# Patient Record
Sex: Female | Born: 1940 | Race: White | Hispanic: No | Marital: Married | Smoking: Never smoker
Health system: Southern US, Community
[De-identification: ages and names within clinical notes are randomized; demographics above are authoritative.]

## PROBLEM LIST (undated history)

## (undated) DIAGNOSIS — F329 Major depressive disorder, single episode, unspecified: Secondary | ICD-10-CM

## (undated) DIAGNOSIS — D219 Benign neoplasm of connective and other soft tissue, unspecified: Secondary | ICD-10-CM

## (undated) DIAGNOSIS — M8588 Other specified disorders of bone density and structure, other site: Secondary | ICD-10-CM

## (undated) DIAGNOSIS — M069 Rheumatoid arthritis, unspecified: Secondary | ICD-10-CM

## (undated) DIAGNOSIS — E7800 Pure hypercholesterolemia, unspecified: Secondary | ICD-10-CM

## (undated) DIAGNOSIS — E739 Lactose intolerance, unspecified: Secondary | ICD-10-CM

## (undated) DIAGNOSIS — M353 Polymyalgia rheumatica: Secondary | ICD-10-CM

## (undated) DIAGNOSIS — D649 Anemia, unspecified: Secondary | ICD-10-CM

## (undated) DIAGNOSIS — E559 Vitamin D deficiency, unspecified: Secondary | ICD-10-CM

## (undated) DIAGNOSIS — E063 Autoimmune thyroiditis: Secondary | ICD-10-CM

## (undated) DIAGNOSIS — M85859 Other specified disorders of bone density and structure, unspecified thigh: Secondary | ICD-10-CM

## (undated) DIAGNOSIS — E785 Hyperlipidemia, unspecified: Secondary | ICD-10-CM

## (undated) DIAGNOSIS — F4323 Adjustment disorder with mixed anxiety and depressed mood: Secondary | ICD-10-CM

## (undated) DIAGNOSIS — I10 Essential (primary) hypertension: Secondary | ICD-10-CM

## (undated) DIAGNOSIS — Z8739 Personal history of other diseases of the musculoskeletal system and connective tissue: Secondary | ICD-10-CM

## (undated) DIAGNOSIS — R202 Paresthesia of skin: Secondary | ICD-10-CM

## (undated) HISTORY — DX: Autoimmune thyroiditis: E06.3

## (undated) HISTORY — DX: Other specified disorders of bone density and structure, other site: M85.88

## (undated) HISTORY — DX: Vitamin D deficiency, unspecified: E55.9

## (undated) HISTORY — DX: Hyperlipidemia, unspecified: E78.5

## (undated) HISTORY — DX: Pure hypercholesterolemia, unspecified: E78.00

## (undated) HISTORY — DX: Benign neoplasm of connective and other soft tissue, unspecified: D21.9

## (undated) HISTORY — DX: Essential (primary) hypertension: I10

## (undated) HISTORY — DX: Lactose intolerance, unspecified: E73.9

## (undated) HISTORY — DX: Adjustment disorder with mixed anxiety and depressed mood: F43.23

## (undated) HISTORY — DX: Paresthesia of skin: R20.2

## (undated) HISTORY — DX: Polymyalgia rheumatica: M35.3

## (undated) HISTORY — DX: Rheumatoid arthritis, unspecified: M06.9

## (undated) HISTORY — DX: Personal history of other diseases of the musculoskeletal system and connective tissue: Z87.39

## (undated) HISTORY — DX: Other specified disorders of bone density and structure, unspecified thigh: M85.859

## (undated) HISTORY — DX: Major depressive disorder, single episode, unspecified: F32.9

---
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
## 1981-04-08 HISTORY — PX: PARTIAL HYSTERECTOMY: SHX80

## 1982-04-08 HISTORY — PX: ABDOMINAL HYSTERECTOMY: SHX81

## 2001-04-08 HISTORY — PX: COLONOSCOPY: SHX174

## 2011-04-09 HISTORY — PX: COLONOSCOPY: SHX174

## 2011-04-18 DIAGNOSIS — Z23 Encounter for immunization: Secondary | ICD-10-CM | POA: Diagnosis not present

## 2011-05-14 DIAGNOSIS — M353 Polymyalgia rheumatica: Secondary | ICD-10-CM | POA: Diagnosis not present

## 2011-05-14 DIAGNOSIS — M899 Disorder of bone, unspecified: Secondary | ICD-10-CM | POA: Diagnosis not present

## 2011-05-14 DIAGNOSIS — M069 Rheumatoid arthritis, unspecified: Secondary | ICD-10-CM | POA: Diagnosis not present

## 2011-05-14 DIAGNOSIS — Z79899 Other long term (current) drug therapy: Secondary | ICD-10-CM | POA: Diagnosis not present

## 2011-05-14 DIAGNOSIS — M949 Disorder of cartilage, unspecified: Secondary | ICD-10-CM | POA: Diagnosis not present

## 2011-05-29 DIAGNOSIS — R079 Chest pain, unspecified: Secondary | ICD-10-CM | POA: Diagnosis not present

## 2011-05-29 DIAGNOSIS — E785 Hyperlipidemia, unspecified: Secondary | ICD-10-CM | POA: Diagnosis not present

## 2011-05-29 DIAGNOSIS — I119 Hypertensive heart disease without heart failure: Secondary | ICD-10-CM | POA: Diagnosis not present

## 2011-05-29 DIAGNOSIS — I10 Essential (primary) hypertension: Secondary | ICD-10-CM | POA: Diagnosis not present

## 2011-06-11 DIAGNOSIS — I10 Essential (primary) hypertension: Secondary | ICD-10-CM | POA: Diagnosis not present

## 2011-06-11 DIAGNOSIS — E785 Hyperlipidemia, unspecified: Secondary | ICD-10-CM | POA: Diagnosis not present

## 2011-07-10 DIAGNOSIS — M353 Polymyalgia rheumatica: Secondary | ICD-10-CM | POA: Diagnosis not present

## 2011-07-10 DIAGNOSIS — M069 Rheumatoid arthritis, unspecified: Secondary | ICD-10-CM | POA: Diagnosis not present

## 2011-07-10 DIAGNOSIS — Z79899 Other long term (current) drug therapy: Secondary | ICD-10-CM | POA: Diagnosis not present

## 2011-07-10 DIAGNOSIS — M2550 Pain in unspecified joint: Secondary | ICD-10-CM | POA: Diagnosis not present

## 2011-07-12 DIAGNOSIS — E785 Hyperlipidemia, unspecified: Secondary | ICD-10-CM | POA: Diagnosis not present

## 2011-08-28 DIAGNOSIS — M353 Polymyalgia rheumatica: Secondary | ICD-10-CM | POA: Diagnosis not present

## 2011-08-28 DIAGNOSIS — Z79899 Other long term (current) drug therapy: Secondary | ICD-10-CM | POA: Diagnosis not present

## 2011-08-28 DIAGNOSIS — M069 Rheumatoid arthritis, unspecified: Secondary | ICD-10-CM | POA: Diagnosis not present

## 2012-01-01 DIAGNOSIS — Z23 Encounter for immunization: Secondary | ICD-10-CM | POA: Diagnosis not present

## 2012-01-01 DIAGNOSIS — I059 Rheumatic mitral valve disease, unspecified: Secondary | ICD-10-CM | POA: Diagnosis not present

## 2012-01-01 DIAGNOSIS — I10 Essential (primary) hypertension: Secondary | ICD-10-CM | POA: Diagnosis not present

## 2012-01-01 DIAGNOSIS — M069 Rheumatoid arthritis, unspecified: Secondary | ICD-10-CM | POA: Diagnosis not present

## 2012-01-01 DIAGNOSIS — Z1212 Encounter for screening for malignant neoplasm of rectum: Secondary | ICD-10-CM | POA: Diagnosis not present

## 2012-01-01 DIAGNOSIS — M353 Polymyalgia rheumatica: Secondary | ICD-10-CM | POA: Diagnosis not present

## 2012-01-02 DIAGNOSIS — E7800 Pure hypercholesterolemia, unspecified: Secondary | ICD-10-CM | POA: Diagnosis not present

## 2012-01-02 DIAGNOSIS — M069 Rheumatoid arthritis, unspecified: Secondary | ICD-10-CM | POA: Diagnosis not present

## 2012-01-02 DIAGNOSIS — Z79899 Other long term (current) drug therapy: Secondary | ICD-10-CM | POA: Diagnosis not present

## 2012-01-02 DIAGNOSIS — I10 Essential (primary) hypertension: Secondary | ICD-10-CM | POA: Diagnosis not present

## 2012-01-03 DIAGNOSIS — M353 Polymyalgia rheumatica: Secondary | ICD-10-CM | POA: Diagnosis not present

## 2012-01-03 DIAGNOSIS — M069 Rheumatoid arthritis, unspecified: Secondary | ICD-10-CM | POA: Diagnosis not present

## 2012-01-15 DIAGNOSIS — Z780 Asymptomatic menopausal state: Secondary | ICD-10-CM | POA: Diagnosis not present

## 2012-01-31 DIAGNOSIS — I059 Rheumatic mitral valve disease, unspecified: Secondary | ICD-10-CM | POA: Diagnosis not present

## 2012-01-31 DIAGNOSIS — I10 Essential (primary) hypertension: Secondary | ICD-10-CM | POA: Diagnosis not present

## 2012-01-31 DIAGNOSIS — E782 Mixed hyperlipidemia: Secondary | ICD-10-CM | POA: Diagnosis not present

## 2012-02-07 HISTORY — PX: TRANSTHORACIC ECHOCARDIOGRAM: SHX275

## 2012-02-20 ENCOUNTER — Other Ambulatory Visit (HOSPITAL_COMMUNITY): Payer: Self-pay

## 2012-02-20 DIAGNOSIS — I059 Rheumatic mitral valve disease, unspecified: Secondary | ICD-10-CM

## 2012-02-24 DIAGNOSIS — D126 Benign neoplasm of colon, unspecified: Secondary | ICD-10-CM | POA: Diagnosis not present

## 2012-02-24 DIAGNOSIS — K648 Other hemorrhoids: Secondary | ICD-10-CM | POA: Diagnosis not present

## 2012-02-24 DIAGNOSIS — Z1211 Encounter for screening for malignant neoplasm of colon: Secondary | ICD-10-CM | POA: Diagnosis not present

## 2012-02-26 ENCOUNTER — Ambulatory Visit (HOSPITAL_COMMUNITY)
Admission: RE | Admit: 2012-02-26 | Discharge: 2012-02-26 | Disposition: A | Discharge status: Home / Self Care | Payer: Medicare Other | Source: Ambulatory Visit | Attending: Cardiology | Admitting: Cardiology

## 2012-02-26 DIAGNOSIS — E785 Hyperlipidemia, unspecified: Secondary | ICD-10-CM | POA: Insufficient documentation

## 2012-02-26 DIAGNOSIS — I059 Rheumatic mitral valve disease, unspecified: Secondary | ICD-10-CM | POA: Diagnosis not present

## 2012-02-26 DIAGNOSIS — I10 Essential (primary) hypertension: Secondary | ICD-10-CM | POA: Insufficient documentation

## 2012-02-26 DIAGNOSIS — I079 Rheumatic tricuspid valve disease, unspecified: Secondary | ICD-10-CM | POA: Insufficient documentation

## 2012-02-26 NOTE — Progress Notes (Signed)
 [facility]   2D echo completed 02/26/2012.   Veda Canning, RDCS

## 2012-03-04 DIAGNOSIS — M353 Polymyalgia rheumatica: Secondary | ICD-10-CM | POA: Diagnosis not present

## 2012-03-04 DIAGNOSIS — M069 Rheumatoid arthritis, unspecified: Secondary | ICD-10-CM | POA: Diagnosis not present

## 2012-03-12 DIAGNOSIS — E782 Mixed hyperlipidemia: Secondary | ICD-10-CM | POA: Diagnosis not present

## 2012-03-12 DIAGNOSIS — I10 Essential (primary) hypertension: Secondary | ICD-10-CM | POA: Diagnosis not present

## 2012-07-07 DIAGNOSIS — M069 Rheumatoid arthritis, unspecified: Secondary | ICD-10-CM | POA: Diagnosis not present

## 2012-07-07 DIAGNOSIS — M79609 Pain in unspecified limb: Secondary | ICD-10-CM | POA: Diagnosis not present

## 2012-07-07 DIAGNOSIS — M353 Polymyalgia rheumatica: Secondary | ICD-10-CM | POA: Diagnosis not present

## 2012-07-28 ENCOUNTER — Other Ambulatory Visit: Payer: Self-pay

## 2012-07-28 DIAGNOSIS — Z1231 Encounter for screening mammogram for malignant neoplasm of breast: Secondary | ICD-10-CM

## 2012-09-02 ENCOUNTER — Ambulatory Visit: Payer: PRIVATE HEALTH INSURANCE

## 2012-09-17 ENCOUNTER — Other Ambulatory Visit: Payer: Self-pay | Admitting: Internal Medicine

## 2012-09-17 DIAGNOSIS — Z0000 Encounter for general adult medical examination without abnormal findings: Secondary | ICD-10-CM | POA: Diagnosis not present

## 2012-09-17 DIAGNOSIS — M353 Polymyalgia rheumatica: Secondary | ICD-10-CM | POA: Diagnosis not present

## 2012-09-17 DIAGNOSIS — Z006 Encounter for examination for normal comparison and control in clinical research program: Secondary | ICD-10-CM | POA: Diagnosis not present

## 2012-09-17 DIAGNOSIS — E7800 Pure hypercholesterolemia, unspecified: Secondary | ICD-10-CM | POA: Diagnosis not present

## 2012-09-17 DIAGNOSIS — G629 Polyneuropathy, unspecified: Secondary | ICD-10-CM

## 2012-09-17 DIAGNOSIS — G609 Hereditary and idiopathic neuropathy, unspecified: Secondary | ICD-10-CM | POA: Diagnosis not present

## 2012-09-17 DIAGNOSIS — K219 Gastro-esophageal reflux disease without esophagitis: Secondary | ICD-10-CM | POA: Diagnosis not present

## 2012-09-17 DIAGNOSIS — I10 Essential (primary) hypertension: Secondary | ICD-10-CM | POA: Diagnosis not present

## 2012-09-18 DIAGNOSIS — E7800 Pure hypercholesterolemia, unspecified: Secondary | ICD-10-CM | POA: Diagnosis not present

## 2012-09-22 ENCOUNTER — Ambulatory Visit
Admission: RE | Admit: 2012-09-22 | Discharge: 2012-09-22 | Disposition: A | Discharge status: Home / Self Care | Payer: Medicare Other | Source: Ambulatory Visit | Attending: Internal Medicine | Admitting: Internal Medicine

## 2012-09-22 DIAGNOSIS — G629 Polyneuropathy, unspecified: Secondary | ICD-10-CM

## 2012-09-22 DIAGNOSIS — I739 Peripheral vascular disease, unspecified: Secondary | ICD-10-CM | POA: Diagnosis not present

## 2012-09-24 ENCOUNTER — Ambulatory Visit
Admission: RE | Admit: 2012-09-24 | Discharge: 2012-09-24 | Disposition: A | Discharge status: Home / Self Care | Payer: PRIVATE HEALTH INSURANCE | Source: Ambulatory Visit

## 2012-09-24 DIAGNOSIS — Z1231 Encounter for screening mammogram for malignant neoplasm of breast: Secondary | ICD-10-CM

## 2012-09-30 DIAGNOSIS — G609 Hereditary and idiopathic neuropathy, unspecified: Secondary | ICD-10-CM | POA: Diagnosis not present

## 2012-11-10 DIAGNOSIS — M353 Polymyalgia rheumatica: Secondary | ICD-10-CM | POA: Diagnosis not present

## 2012-11-10 DIAGNOSIS — M7660 Achilles tendinitis, unspecified leg: Secondary | ICD-10-CM | POA: Diagnosis not present

## 2012-11-10 DIAGNOSIS — M069 Rheumatoid arthritis, unspecified: Secondary | ICD-10-CM | POA: Diagnosis not present

## 2013-01-06 DIAGNOSIS — R05 Cough: Secondary | ICD-10-CM | POA: Diagnosis not present

## 2013-01-06 DIAGNOSIS — R059 Cough, unspecified: Secondary | ICD-10-CM | POA: Diagnosis not present

## 2013-01-06 DIAGNOSIS — R12 Heartburn: Secondary | ICD-10-CM | POA: Diagnosis not present

## 2013-03-01 DIAGNOSIS — Z79899 Other long term (current) drug therapy: Secondary | ICD-10-CM | POA: Diagnosis not present

## 2013-03-01 DIAGNOSIS — I10 Essential (primary) hypertension: Secondary | ICD-10-CM | POA: Diagnosis not present

## 2013-03-01 DIAGNOSIS — Z23 Encounter for immunization: Secondary | ICD-10-CM | POA: Diagnosis not present

## 2013-03-01 DIAGNOSIS — Z0000 Encounter for general adult medical examination without abnormal findings: Secondary | ICD-10-CM | POA: Diagnosis not present

## 2013-03-01 DIAGNOSIS — E7800 Pure hypercholesterolemia, unspecified: Secondary | ICD-10-CM | POA: Diagnosis not present

## 2013-03-01 DIAGNOSIS — M353 Polymyalgia rheumatica: Secondary | ICD-10-CM | POA: Diagnosis not present

## 2013-03-10 DIAGNOSIS — I10 Essential (primary) hypertension: Secondary | ICD-10-CM | POA: Diagnosis not present

## 2013-03-10 DIAGNOSIS — E7800 Pure hypercholesterolemia, unspecified: Secondary | ICD-10-CM | POA: Diagnosis not present

## 2013-03-10 DIAGNOSIS — R059 Cough, unspecified: Secondary | ICD-10-CM | POA: Diagnosis not present

## 2013-03-10 DIAGNOSIS — M069 Rheumatoid arthritis, unspecified: Secondary | ICD-10-CM | POA: Diagnosis not present

## 2013-03-10 DIAGNOSIS — M353 Polymyalgia rheumatica: Secondary | ICD-10-CM | POA: Diagnosis not present

## 2013-03-11 DIAGNOSIS — M069 Rheumatoid arthritis, unspecified: Secondary | ICD-10-CM | POA: Diagnosis not present

## 2013-03-11 DIAGNOSIS — M7660 Achilles tendinitis, unspecified leg: Secondary | ICD-10-CM | POA: Diagnosis not present

## 2013-03-11 DIAGNOSIS — R059 Cough, unspecified: Secondary | ICD-10-CM | POA: Diagnosis not present

## 2013-03-11 DIAGNOSIS — M353 Polymyalgia rheumatica: Secondary | ICD-10-CM | POA: Diagnosis not present

## 2013-03-17 DIAGNOSIS — E2839 Other primary ovarian failure: Secondary | ICD-10-CM | POA: Diagnosis not present

## 2013-04-20 DIAGNOSIS — F411 Generalized anxiety disorder: Secondary | ICD-10-CM | POA: Diagnosis not present

## 2013-04-20 DIAGNOSIS — I10 Essential (primary) hypertension: Secondary | ICD-10-CM | POA: Diagnosis not present

## 2013-04-20 DIAGNOSIS — H6120 Impacted cerumen, unspecified ear: Secondary | ICD-10-CM | POA: Diagnosis not present

## 2013-06-02 DIAGNOSIS — Z79899 Other long term (current) drug therapy: Secondary | ICD-10-CM | POA: Diagnosis not present

## 2013-06-02 DIAGNOSIS — M069 Rheumatoid arthritis, unspecified: Secondary | ICD-10-CM | POA: Diagnosis not present

## 2013-06-07 DIAGNOSIS — H43819 Vitreous degeneration, unspecified eye: Secondary | ICD-10-CM | POA: Diagnosis not present

## 2013-06-09 DIAGNOSIS — M7660 Achilles tendinitis, unspecified leg: Secondary | ICD-10-CM | POA: Diagnosis not present

## 2013-06-09 DIAGNOSIS — M353 Polymyalgia rheumatica: Secondary | ICD-10-CM | POA: Diagnosis not present

## 2013-06-09 DIAGNOSIS — M25559 Pain in unspecified hip: Secondary | ICD-10-CM | POA: Diagnosis not present

## 2013-06-09 DIAGNOSIS — M069 Rheumatoid arthritis, unspecified: Secondary | ICD-10-CM | POA: Diagnosis not present

## 2013-09-08 DIAGNOSIS — M353 Polymyalgia rheumatica: Secondary | ICD-10-CM | POA: Diagnosis not present

## 2013-09-08 DIAGNOSIS — M069 Rheumatoid arthritis, unspecified: Secondary | ICD-10-CM | POA: Diagnosis not present

## 2013-09-08 DIAGNOSIS — M7660 Achilles tendinitis, unspecified leg: Secondary | ICD-10-CM | POA: Diagnosis not present

## 2013-09-08 DIAGNOSIS — M25559 Pain in unspecified hip: Secondary | ICD-10-CM | POA: Diagnosis not present

## 2013-12-07 ENCOUNTER — Other Ambulatory Visit: Payer: Self-pay

## 2013-12-07 DIAGNOSIS — Z1231 Encounter for screening mammogram for malignant neoplasm of breast: Secondary | ICD-10-CM

## 2013-12-08 DIAGNOSIS — Z01419 Encounter for gynecological examination (general) (routine) without abnormal findings: Secondary | ICD-10-CM | POA: Diagnosis not present

## 2013-12-08 DIAGNOSIS — Z79890 Hormone replacement therapy (postmenopausal): Secondary | ICD-10-CM | POA: Diagnosis not present

## 2013-12-10 ENCOUNTER — Ambulatory Visit: Payer: Medicare Other

## 2013-12-16 ENCOUNTER — Encounter (INDEPENDENT_AMBULATORY_CARE_PROVIDER_SITE_OTHER): Payer: Self-pay

## 2013-12-16 ENCOUNTER — Ambulatory Visit
Admission: RE | Admit: 2013-12-16 | Discharge: 2013-12-16 | Disposition: A | Discharge status: Home / Self Care | Payer: Medicare Other | Source: Ambulatory Visit

## 2013-12-16 DIAGNOSIS — Z1231 Encounter for screening mammogram for malignant neoplasm of breast: Secondary | ICD-10-CM

## 2014-01-13 DIAGNOSIS — Z23 Encounter for immunization: Secondary | ICD-10-CM | POA: Diagnosis not present

## 2014-01-13 DIAGNOSIS — M353 Polymyalgia rheumatica: Secondary | ICD-10-CM | POA: Diagnosis not present

## 2014-01-13 DIAGNOSIS — M0589 Other rheumatoid arthritis with rheumatoid factor of multiple sites: Secondary | ICD-10-CM | POA: Diagnosis not present

## 2014-01-13 DIAGNOSIS — M7062 Trochanteric bursitis, left hip: Secondary | ICD-10-CM | POA: Diagnosis not present

## 2014-01-13 DIAGNOSIS — M7660 Achilles tendinitis, unspecified leg: Secondary | ICD-10-CM | POA: Diagnosis not present

## 2014-01-13 DIAGNOSIS — M7072 Other bursitis of hip, left hip: Secondary | ICD-10-CM | POA: Diagnosis not present

## 2014-04-06 DIAGNOSIS — E780 Pure hypercholesterolemia: Secondary | ICD-10-CM | POA: Diagnosis not present

## 2014-04-06 DIAGNOSIS — Z0000 Encounter for general adult medical examination without abnormal findings: Secondary | ICD-10-CM | POA: Diagnosis not present

## 2014-04-13 DIAGNOSIS — I10 Essential (primary) hypertension: Secondary | ICD-10-CM | POA: Diagnosis not present

## 2014-04-13 DIAGNOSIS — E780 Pure hypercholesterolemia: Secondary | ICD-10-CM | POA: Diagnosis not present

## 2014-04-13 DIAGNOSIS — G629 Polyneuropathy, unspecified: Secondary | ICD-10-CM | POA: Diagnosis not present

## 2014-04-13 DIAGNOSIS — L821 Other seborrheic keratosis: Secondary | ICD-10-CM | POA: Diagnosis not present

## 2014-05-04 DIAGNOSIS — H04123 Dry eye syndrome of bilateral lacrimal glands: Secondary | ICD-10-CM | POA: Diagnosis not present

## 2014-05-16 DIAGNOSIS — M353 Polymyalgia rheumatica: Secondary | ICD-10-CM | POA: Diagnosis not present

## 2014-05-16 DIAGNOSIS — M0589 Other rheumatoid arthritis with rheumatoid factor of multiple sites: Secondary | ICD-10-CM | POA: Diagnosis not present

## 2014-05-16 DIAGNOSIS — M7660 Achilles tendinitis, unspecified leg: Secondary | ICD-10-CM | POA: Diagnosis not present

## 2014-05-16 DIAGNOSIS — M7062 Trochanteric bursitis, left hip: Secondary | ICD-10-CM | POA: Diagnosis not present

## 2014-06-07 DIAGNOSIS — Z01419 Encounter for gynecological examination (general) (routine) without abnormal findings: Secondary | ICD-10-CM | POA: Diagnosis not present

## 2014-06-07 DIAGNOSIS — Z780 Asymptomatic menopausal state: Secondary | ICD-10-CM | POA: Diagnosis not present

## 2014-06-07 DIAGNOSIS — Z1212 Encounter for screening for malignant neoplasm of rectum: Secondary | ICD-10-CM | POA: Diagnosis not present

## 2014-08-15 DIAGNOSIS — M7062 Trochanteric bursitis, left hip: Secondary | ICD-10-CM | POA: Diagnosis not present

## 2014-08-15 DIAGNOSIS — M0589 Other rheumatoid arthritis with rheumatoid factor of multiple sites: Secondary | ICD-10-CM | POA: Diagnosis not present

## 2014-08-15 DIAGNOSIS — M353 Polymyalgia rheumatica: Secondary | ICD-10-CM | POA: Diagnosis not present

## 2014-08-15 DIAGNOSIS — M7660 Achilles tendinitis, unspecified leg: Secondary | ICD-10-CM | POA: Diagnosis not present

## 2014-11-29 DIAGNOSIS — M0589 Other rheumatoid arthritis with rheumatoid factor of multiple sites: Secondary | ICD-10-CM | POA: Diagnosis not present

## 2014-11-29 DIAGNOSIS — M353 Polymyalgia rheumatica: Secondary | ICD-10-CM | POA: Diagnosis not present

## 2014-11-29 DIAGNOSIS — M7062 Trochanteric bursitis, left hip: Secondary | ICD-10-CM | POA: Diagnosis not present

## 2015-02-10 ENCOUNTER — Other Ambulatory Visit: Payer: Self-pay

## 2015-02-10 DIAGNOSIS — Z1231 Encounter for screening mammogram for malignant neoplasm of breast: Secondary | ICD-10-CM

## 2015-02-15 ENCOUNTER — Ambulatory Visit
Admission: RE | Admit: 2015-02-15 | Discharge: 2015-02-15 | Disposition: A | Discharge status: Home / Self Care | Payer: Medicare Other | Source: Ambulatory Visit

## 2015-02-15 DIAGNOSIS — Z1231 Encounter for screening mammogram for malignant neoplasm of breast: Secondary | ICD-10-CM | POA: Diagnosis not present

## 2015-03-27 DIAGNOSIS — M7062 Trochanteric bursitis, left hip: Secondary | ICD-10-CM | POA: Diagnosis not present

## 2015-03-27 DIAGNOSIS — M0589 Other rheumatoid arthritis with rheumatoid factor of multiple sites: Secondary | ICD-10-CM | POA: Diagnosis not present

## 2015-03-27 DIAGNOSIS — M353 Polymyalgia rheumatica: Secondary | ICD-10-CM | POA: Diagnosis not present

## 2015-04-12 DIAGNOSIS — Z0000 Encounter for general adult medical examination without abnormal findings: Secondary | ICD-10-CM | POA: Diagnosis not present

## 2015-04-12 DIAGNOSIS — E7800 Pure hypercholesterolemia, unspecified: Secondary | ICD-10-CM | POA: Diagnosis not present

## 2015-04-12 DIAGNOSIS — D649 Anemia, unspecified: Secondary | ICD-10-CM | POA: Diagnosis not present

## 2015-04-12 DIAGNOSIS — Z79899 Other long term (current) drug therapy: Secondary | ICD-10-CM | POA: Diagnosis not present

## 2015-04-12 DIAGNOSIS — I10 Essential (primary) hypertension: Secondary | ICD-10-CM | POA: Diagnosis not present

## 2015-04-22 DIAGNOSIS — Z23 Encounter for immunization: Secondary | ICD-10-CM | POA: Diagnosis not present

## 2015-05-01 DIAGNOSIS — I10 Essential (primary) hypertension: Secondary | ICD-10-CM | POA: Diagnosis not present

## 2015-05-01 DIAGNOSIS — Z0000 Encounter for general adult medical examination without abnormal findings: Secondary | ICD-10-CM | POA: Diagnosis not present

## 2015-05-01 DIAGNOSIS — F329 Major depressive disorder, single episode, unspecified: Secondary | ICD-10-CM | POA: Diagnosis not present

## 2015-05-01 DIAGNOSIS — Z1212 Encounter for screening for malignant neoplasm of rectum: Secondary | ICD-10-CM | POA: Diagnosis not present

## 2015-05-01 DIAGNOSIS — Z01419 Encounter for gynecological examination (general) (routine) without abnormal findings: Secondary | ICD-10-CM | POA: Diagnosis not present

## 2015-05-01 DIAGNOSIS — E7800 Pure hypercholesterolemia, unspecified: Secondary | ICD-10-CM | POA: Diagnosis not present

## 2015-05-26 DIAGNOSIS — M25561 Pain in right knee: Secondary | ICD-10-CM | POA: Diagnosis not present

## 2015-05-26 DIAGNOSIS — M0589 Other rheumatoid arthritis with rheumatoid factor of multiple sites: Secondary | ICD-10-CM | POA: Diagnosis not present

## 2015-05-26 DIAGNOSIS — M353 Polymyalgia rheumatica: Secondary | ICD-10-CM | POA: Diagnosis not present

## 2015-06-15 DIAGNOSIS — I10 Essential (primary) hypertension: Secondary | ICD-10-CM | POA: Diagnosis not present

## 2015-06-15 DIAGNOSIS — Z119 Encounter for screening for infectious and parasitic diseases, unspecified: Secondary | ICD-10-CM | POA: Diagnosis not present

## 2015-06-15 DIAGNOSIS — E559 Vitamin D deficiency, unspecified: Secondary | ICD-10-CM | POA: Diagnosis not present

## 2015-06-15 DIAGNOSIS — Z780 Asymptomatic menopausal state: Secondary | ICD-10-CM | POA: Diagnosis not present

## 2015-06-15 DIAGNOSIS — E538 Deficiency of other specified B group vitamins: Secondary | ICD-10-CM | POA: Diagnosis not present

## 2015-06-15 DIAGNOSIS — E785 Hyperlipidemia, unspecified: Secondary | ICD-10-CM | POA: Diagnosis not present

## 2015-07-26 DIAGNOSIS — M25561 Pain in right knee: Secondary | ICD-10-CM | POA: Diagnosis not present

## 2015-07-26 DIAGNOSIS — M0589 Other rheumatoid arthritis with rheumatoid factor of multiple sites: Secondary | ICD-10-CM | POA: Diagnosis not present

## 2015-07-26 DIAGNOSIS — Z79899 Other long term (current) drug therapy: Secondary | ICD-10-CM | POA: Diagnosis not present

## 2015-07-26 DIAGNOSIS — M353 Polymyalgia rheumatica: Secondary | ICD-10-CM | POA: Diagnosis not present

## 2015-07-31 ENCOUNTER — Other Ambulatory Visit: Payer: Self-pay | Admitting: Physician Assistant

## 2015-07-31 DIAGNOSIS — M25561 Pain in right knee: Secondary | ICD-10-CM

## 2015-08-07 ENCOUNTER — Ambulatory Visit
Admission: RE | Admit: 2015-08-07 | Discharge: 2015-08-07 | Disposition: A | Discharge status: Home / Self Care | Payer: Medicare Other | Source: Ambulatory Visit | Attending: Physician Assistant | Admitting: Physician Assistant

## 2015-08-07 DIAGNOSIS — M25561 Pain in right knee: Secondary | ICD-10-CM

## 2015-08-07 DIAGNOSIS — S83271A Complex tear of lateral meniscus, current injury, right knee, initial encounter: Secondary | ICD-10-CM | POA: Diagnosis not present

## 2015-09-25 DIAGNOSIS — Z7189 Other specified counseling: Secondary | ICD-10-CM | POA: Diagnosis not present

## 2015-09-25 DIAGNOSIS — Z0000 Encounter for general adult medical examination without abnormal findings: Secondary | ICD-10-CM | POA: Diagnosis not present

## 2015-09-25 DIAGNOSIS — Z23 Encounter for immunization: Secondary | ICD-10-CM | POA: Diagnosis not present

## 2015-10-12 DIAGNOSIS — Z1211 Encounter for screening for malignant neoplasm of colon: Secondary | ICD-10-CM | POA: Diagnosis not present

## 2015-11-22 DIAGNOSIS — M25561 Pain in right knee: Secondary | ICD-10-CM | POA: Diagnosis not present

## 2015-11-22 DIAGNOSIS — M353 Polymyalgia rheumatica: Secondary | ICD-10-CM | POA: Diagnosis not present

## 2015-11-22 DIAGNOSIS — M150 Primary generalized (osteo)arthritis: Secondary | ICD-10-CM | POA: Diagnosis not present

## 2015-11-22 DIAGNOSIS — M7989 Other specified soft tissue disorders: Secondary | ICD-10-CM | POA: Diagnosis not present

## 2015-11-22 DIAGNOSIS — Z79899 Other long term (current) drug therapy: Secondary | ICD-10-CM | POA: Diagnosis not present

## 2015-11-22 DIAGNOSIS — M0589 Other rheumatoid arthritis with rheumatoid factor of multiple sites: Secondary | ICD-10-CM | POA: Diagnosis not present

## 2016-02-02 ENCOUNTER — Other Ambulatory Visit: Payer: Self-pay | Admitting: Internal Medicine

## 2016-02-02 DIAGNOSIS — Z1231 Encounter for screening mammogram for malignant neoplasm of breast: Secondary | ICD-10-CM

## 2016-02-13 DIAGNOSIS — Z23 Encounter for immunization: Secondary | ICD-10-CM | POA: Diagnosis not present

## 2016-02-27 ENCOUNTER — Ambulatory Visit
Admission: RE | Admit: 2016-02-27 | Discharge: 2016-02-27 | Disposition: A | Discharge status: Home / Self Care | Payer: Medicare Other | Source: Ambulatory Visit | Attending: Internal Medicine | Admitting: Internal Medicine

## 2016-02-27 DIAGNOSIS — Z1231 Encounter for screening mammogram for malignant neoplasm of breast: Secondary | ICD-10-CM | POA: Diagnosis not present

## 2016-03-25 DIAGNOSIS — M25561 Pain in right knee: Secondary | ICD-10-CM | POA: Diagnosis not present

## 2016-03-25 DIAGNOSIS — Z79899 Other long term (current) drug therapy: Secondary | ICD-10-CM | POA: Diagnosis not present

## 2016-03-25 DIAGNOSIS — M353 Polymyalgia rheumatica: Secondary | ICD-10-CM | POA: Diagnosis not present

## 2016-03-25 DIAGNOSIS — M150 Primary generalized (osteo)arthritis: Secondary | ICD-10-CM | POA: Diagnosis not present

## 2016-03-25 DIAGNOSIS — M0589 Other rheumatoid arthritis with rheumatoid factor of multiple sites: Secondary | ICD-10-CM | POA: Diagnosis not present

## 2016-03-25 DIAGNOSIS — Z6826 Body mass index (BMI) 26.0-26.9, adult: Secondary | ICD-10-CM | POA: Diagnosis not present

## 2016-03-25 DIAGNOSIS — E663 Overweight: Secondary | ICD-10-CM | POA: Diagnosis not present

## 2016-04-04 DIAGNOSIS — E739 Lactose intolerance, unspecified: Secondary | ICD-10-CM | POA: Diagnosis not present

## 2016-04-04 DIAGNOSIS — E7800 Pure hypercholesterolemia, unspecified: Secondary | ICD-10-CM | POA: Diagnosis not present

## 2016-04-04 DIAGNOSIS — E559 Vitamin D deficiency, unspecified: Secondary | ICD-10-CM | POA: Diagnosis not present

## 2016-04-04 DIAGNOSIS — M069 Rheumatoid arthritis, unspecified: Secondary | ICD-10-CM | POA: Diagnosis not present

## 2016-04-04 DIAGNOSIS — F4323 Adjustment disorder with mixed anxiety and depressed mood: Secondary | ICD-10-CM | POA: Diagnosis not present

## 2016-05-08 DIAGNOSIS — H2513 Age-related nuclear cataract, bilateral: Secondary | ICD-10-CM | POA: Diagnosis not present

## 2016-05-08 DIAGNOSIS — H5203 Hypermetropia, bilateral: Secondary | ICD-10-CM | POA: Diagnosis not present

## 2016-07-26 DIAGNOSIS — M353 Polymyalgia rheumatica: Secondary | ICD-10-CM | POA: Diagnosis not present

## 2016-07-26 DIAGNOSIS — E663 Overweight: Secondary | ICD-10-CM | POA: Diagnosis not present

## 2016-07-26 DIAGNOSIS — M0589 Other rheumatoid arthritis with rheumatoid factor of multiple sites: Secondary | ICD-10-CM | POA: Diagnosis not present

## 2016-07-26 DIAGNOSIS — M7072 Other bursitis of hip, left hip: Secondary | ICD-10-CM | POA: Diagnosis not present

## 2016-07-26 DIAGNOSIS — Z6827 Body mass index (BMI) 27.0-27.9, adult: Secondary | ICD-10-CM | POA: Diagnosis not present

## 2016-07-26 DIAGNOSIS — M150 Primary generalized (osteo)arthritis: Secondary | ICD-10-CM | POA: Diagnosis not present

## 2016-07-26 DIAGNOSIS — Z79899 Other long term (current) drug therapy: Secondary | ICD-10-CM | POA: Diagnosis not present

## 2016-10-16 DIAGNOSIS — Z0000 Encounter for general adult medical examination without abnormal findings: Secondary | ICD-10-CM | POA: Diagnosis not present

## 2016-10-16 DIAGNOSIS — Z23 Encounter for immunization: Secondary | ICD-10-CM | POA: Diagnosis not present

## 2016-10-16 DIAGNOSIS — Z1389 Encounter for screening for other disorder: Secondary | ICD-10-CM | POA: Diagnosis not present

## 2016-10-16 DIAGNOSIS — E7800 Pure hypercholesterolemia, unspecified: Secondary | ICD-10-CM | POA: Diagnosis not present

## 2016-10-16 DIAGNOSIS — I10 Essential (primary) hypertension: Secondary | ICD-10-CM | POA: Diagnosis not present

## 2016-10-16 DIAGNOSIS — E559 Vitamin D deficiency, unspecified: Secondary | ICD-10-CM | POA: Diagnosis not present

## 2016-11-25 DIAGNOSIS — Z6826 Body mass index (BMI) 26.0-26.9, adult: Secondary | ICD-10-CM | POA: Diagnosis not present

## 2016-11-25 DIAGNOSIS — M150 Primary generalized (osteo)arthritis: Secondary | ICD-10-CM | POA: Diagnosis not present

## 2016-11-25 DIAGNOSIS — E663 Overweight: Secondary | ICD-10-CM | POA: Diagnosis not present

## 2016-11-25 DIAGNOSIS — Z79899 Other long term (current) drug therapy: Secondary | ICD-10-CM | POA: Diagnosis not present

## 2016-11-25 DIAGNOSIS — M7072 Other bursitis of hip, left hip: Secondary | ICD-10-CM | POA: Diagnosis not present

## 2016-11-25 DIAGNOSIS — M353 Polymyalgia rheumatica: Secondary | ICD-10-CM | POA: Diagnosis not present

## 2016-11-25 DIAGNOSIS — M0589 Other rheumatoid arthritis with rheumatoid factor of multiple sites: Secondary | ICD-10-CM | POA: Diagnosis not present

## 2017-01-28 DIAGNOSIS — E2839 Other primary ovarian failure: Secondary | ICD-10-CM | POA: Diagnosis not present

## 2017-01-28 DIAGNOSIS — M8588 Other specified disorders of bone density and structure, other site: Secondary | ICD-10-CM | POA: Diagnosis not present

## 2017-03-24 DIAGNOSIS — M0589 Other rheumatoid arthritis with rheumatoid factor of multiple sites: Secondary | ICD-10-CM | POA: Diagnosis not present

## 2017-03-24 DIAGNOSIS — M353 Polymyalgia rheumatica: Secondary | ICD-10-CM | POA: Diagnosis not present

## 2017-03-24 DIAGNOSIS — Z79899 Other long term (current) drug therapy: Secondary | ICD-10-CM | POA: Diagnosis not present

## 2017-03-24 DIAGNOSIS — M150 Primary generalized (osteo)arthritis: Secondary | ICD-10-CM | POA: Diagnosis not present

## 2017-03-24 DIAGNOSIS — M7072 Other bursitis of hip, left hip: Secondary | ICD-10-CM | POA: Diagnosis not present

## 2017-03-24 DIAGNOSIS — Z6827 Body mass index (BMI) 27.0-27.9, adult: Secondary | ICD-10-CM | POA: Diagnosis not present

## 2017-03-24 DIAGNOSIS — E663 Overweight: Secondary | ICD-10-CM | POA: Diagnosis not present

## 2017-04-10 ENCOUNTER — Other Ambulatory Visit: Payer: Self-pay | Admitting: Internal Medicine

## 2017-04-10 DIAGNOSIS — Z1231 Encounter for screening mammogram for malignant neoplasm of breast: Secondary | ICD-10-CM

## 2017-04-29 ENCOUNTER — Ambulatory Visit
Admission: RE | Admit: 2017-04-29 | Discharge: 2017-04-29 | Disposition: A | Discharge status: Home / Self Care | Payer: Medicare Other | Source: Ambulatory Visit | Attending: Internal Medicine | Admitting: Internal Medicine

## 2017-04-29 DIAGNOSIS — Z1231 Encounter for screening mammogram for malignant neoplasm of breast: Secondary | ICD-10-CM | POA: Diagnosis not present

## 2017-04-30 DIAGNOSIS — I10 Essential (primary) hypertension: Secondary | ICD-10-CM | POA: Diagnosis not present

## 2017-04-30 DIAGNOSIS — M069 Rheumatoid arthritis, unspecified: Secondary | ICD-10-CM | POA: Diagnosis not present

## 2017-04-30 DIAGNOSIS — F4323 Adjustment disorder with mixed anxiety and depressed mood: Secondary | ICD-10-CM | POA: Diagnosis not present

## 2017-04-30 DIAGNOSIS — E7800 Pure hypercholesterolemia, unspecified: Secondary | ICD-10-CM | POA: Diagnosis not present

## 2017-04-30 DIAGNOSIS — E559 Vitamin D deficiency, unspecified: Secondary | ICD-10-CM | POA: Diagnosis not present

## 2017-04-30 DIAGNOSIS — Z23 Encounter for immunization: Secondary | ICD-10-CM | POA: Diagnosis not present

## 2017-05-06 DIAGNOSIS — I10 Essential (primary) hypertension: Secondary | ICD-10-CM | POA: Diagnosis not present

## 2017-05-06 DIAGNOSIS — M069 Rheumatoid arthritis, unspecified: Secondary | ICD-10-CM | POA: Diagnosis not present

## 2017-05-12 ENCOUNTER — Ambulatory Visit (INDEPENDENT_AMBULATORY_CARE_PROVIDER_SITE_OTHER): Payer: Medicare Other | Admitting: Cardiology

## 2017-05-12 ENCOUNTER — Encounter: Payer: Self-pay | Admitting: Cardiology

## 2017-05-12 VITALS — BP 122/64 | HR 82 | Ht 63.5 in | Wt 163.2 lb

## 2017-05-12 DIAGNOSIS — R0600 Dyspnea, unspecified: Secondary | ICD-10-CM | POA: Insufficient documentation

## 2017-05-12 DIAGNOSIS — R079 Chest pain, unspecified: Secondary | ICD-10-CM | POA: Insufficient documentation

## 2017-05-12 DIAGNOSIS — R072 Precordial pain: Secondary | ICD-10-CM

## 2017-05-12 DIAGNOSIS — E785 Hyperlipidemia, unspecified: Secondary | ICD-10-CM | POA: Insufficient documentation

## 2017-05-12 DIAGNOSIS — Z8249 Family history of ischemic heart disease and other diseases of the circulatory system: Secondary | ICD-10-CM | POA: Diagnosis not present

## 2017-05-12 DIAGNOSIS — I10 Essential (primary) hypertension: Secondary | ICD-10-CM | POA: Diagnosis not present

## 2017-05-12 DIAGNOSIS — R0609 Other forms of dyspnea: Secondary | ICD-10-CM | POA: Diagnosis not present

## 2017-05-12 MED ORDER — METOPROLOL SUCCINATE ER 25 MG PO TB24: 25.0000 mg | ORAL_TABLET | Freq: Every day | ORAL | 3 refills | Status: DC

## 2017-05-12 NOTE — Progress Notes (Signed)
PCP: Lanice Shirts, MD  Clinic Note: Chief Complaint  Patient presents with  . New Patient (Initial Visit)    (Last seen in 2013).  Hypertension, exertional dyspnea, chest discomfort    HPI: April Terry is a 77 y.o. female with a PMH below who presents today for the evaluation of hypertension/exertional dyspnea with a family h/o CHF MRI is seen at the request of Schoenhoff, Altamese Cabal, MD  During her last visit with her PCP, she was noticing having some increasing levels of her blood pressure that she is somewhat concerned about. 2  April Terry was last seen for palpitations and cardiac risk evaluation back in 2013.  She did have an echocardiogram performed which did not show mitral prolapse.  Was otherwise normal as noted below.  Recent Hospitalizations: None  Studies Personally Reviewed - (if available, images/films reviewed: From Epic Chart or Care Everywhere)  Transthoracic Echo November 2013: Mild concentric hypertrophy.  EF 55-60%.  No regional wall motion normality.  No mitral prolapse noted.  Interval History: April Terry comes in today noticing for the last maybe 6+ weeks her blood pressure has been gradually climbing to where now is been in the 140/70 range on her home scale she states that she tends to build feel it when her blood pressure is higher and that she notices a little bit of morbid edge, may be a bit of a headache, and little bit more short of breath doing things.  She has had occasional rare palpitations, but mostly has noted that she gets short of breath if she for instance will try to"walk and talk at the same time ".  She has been doing what she can to try to get more active and stay active, and is concerned because she is being short of breath.  She is trying to walk routinely, and is able to do about a mile to without significant symptoms, but if she goes fast she will get short of breath.  At this point she is more limited by rheumatoid arthritis pains, but  when that starts hurting, her dyspnea notably worsens.  She is a bit concerned that occasionally when she has her shortness of breath with exertion she may have a little bit of tightness or pressure in the chest that is associated with it.  No resting chest discomfort, with exception some few mild aches and pains here and that that are not consistent..  She has no resting dyspnea as well as no PND, orthopnea or edema.  Rare palpitations, but denies lightheadedness, dizziness, weakness, syncope/near syncope, or TIA/amaurosis fugax symptoms. No claudication.  ROS: A comprehensive was performed. Review of Systems  Constitutional: Positive for malaise/fatigue (Not as much energy as she would like). Negative for weight loss.  HENT: Negative for congestion and nosebleeds.   Respiratory: Positive for shortness of breath (Per HPI). Negative for cough.   Cardiovascular:       Per HPI  Gastrointestinal: Negative for abdominal pain, blood in stool, heartburn and melena.  Genitourinary: Negative for hematuria.  Musculoskeletal: Positive for back pain (Off and on) and joint pain (Baseline mild rheumatoid arthritis related pains).  Neurological: Positive for dizziness (Occasional positional), tingling (Pedal peripheral neuropathy) and headaches. Negative for weakness.  Psychiatric/Behavioral: Negative for depression. The patient is nervous/anxious.   All other systems reviewed and are negative.  I have reviewed and (if needed) personally updated the patient's problem list, medications, allergies, past medical and surgical history, social and family history.   Past Medical  History:  Diagnosis Date  . Essential hypertension   . H/O polymyalgia rheumatica    Was symptoms have mostly resolved after prolonged treatment with steroids  . Hyperlipidemia    Well-controlled on fish oil and low-dose Lipitor.  . Rheumatoid arthritis involving both hands ([place])   . Situational mixed anxiety and depressive  disorder     Past Surgical History:  Procedure Laterality Date  . ABDOMINAL HYSTERECTOMY  1984  . APPENDECTOMY  1950  . TRANSTHORACIC ECHOCARDIOGRAM  02/2012   Mild concentric hypertrophy.  EF 55-60%.  No regional wall motion normality.  No mitral prolapse noted.    Current Meds  Medication Sig  . atorvastatin (LIPITOR) 10 MG tablet Take 10 mg by mouth daily.  . Calcium Carb-Cholecalciferol (CALCIUM 1000 + D) 1000-800 MG-UNIT TABS Take 1 tablet by mouth daily.  . Cholecalciferol (D3 HIGH POTENCY) 1000 units capsule Take 1,000 Units by mouth daily.  . Magnesium 500 MG TABS Take 1 tablet by mouth daily.  Marland Kitchen olmesartan-hydrochlorothiazide (BENICAR HCT) 20-12.5 MG tablet Take 1 tablet by mouth daily.  . sertraline (ZOLOFT) 100 MG tablet Take 1 tablet by mouth daily.  Marland Kitchen sulfaSALAzine (AZULFIDINE) 500 MG EC tablet Take 4 tablets by mouth daily.    No Known Allergies  Social History   Tobacco Use  . Smoking status: Never Smoker  . Smokeless tobacco: Never Used  Substance Use Topics  . Alcohol use: Yes    Alcohol/week: 1.2 oz    Types: 2 Glasses of wine per week    Comment: Rare, occasional  . Drug use: No   Social History   Social History Narrative   Married [name]) mother of 2, 3 grandchildren and 2 great-grandchildren.    family history includes Arthritis in her brother; Heart failure in her mother and sister; Obesity in her brother; Prostate cancer in her father.  Wt Readings from Last 3 Encounters:  05/12/17 163 lb 3.2 oz (74 kg)    PHYSICAL EXAM BP 122/64   Pulse 82   Ht 5' 3.5" (1.613 m)   Wt 163 lb 3.2 oz (74 kg)   BMI 28.46 kg/m  Physical Exam  Constitutional: She is oriented to person, place, and time. She appears well-developed and well-nourished. No distress.  Healthy-appearing.  Well-groomed.  HENT:  Head: Normocephalic and atraumatic.  Mouth/Throat: Oropharynx is clear and moist. No oropharyngeal exudate.  Eyes: Conjunctivae and EOM are normal.  Pupils are equal, round, and reactive to light. Left eye exhibits no discharge. No scleral icterus.  Neck: Normal range of motion. Neck supple. No hepatojugular reflux and no JVD present. Carotid bruit is not present. No tracheal deviation present. No thyromegaly present.  Cardiovascular: Normal rate, regular rhythm, normal heart sounds, intact distal pulses and normal pulses.  No extrasystoles are present. PMI is displaced (Rare). Exam reveals no gallop and no friction rub.  No murmur heard. Pulmonary/Chest: Effort normal and breath sounds normal. No respiratory distress. She has no rales.  Abdominal: Soft. Bowel sounds are normal. She exhibits no distension. There is no tenderness. There is no rebound.  Musculoskeletal: Normal range of motion. She exhibits no edema.  Neurological: She is alert and oriented to person, place, and time. No cranial nerve deficit.  Psychiatric: She has a normal mood and affect. Her behavior is normal. Judgment and thought content normal.     Adult ECG Report  Rate: 82;  Rhythm: normal sinus rhythm and Left atrial enlargement.  Otherwise normal axis, intervals and durations.;   Narrative  Interpretation: Normal EKG.   Other studies Reviewed: Additional studies/ records that were reviewed today include:  Recent Labs: Pertinent labs from PCPs office July 2018: TC 141, TG 149, HDL 47, LDL 64.  TSH 3.8.  BUN/creatinine 11/0.73.  Potassium 3.6.  Normal LFTs.  Normal CBC.   ASSESSMENT / PLAN: Problem List Items Addressed This Visit    Chest pain with moderate risk for cardiac etiology    She has some symptoms that are somewhat atypical in nature do not really sound when actually described as being true exertional chest pain symptoms.  Related symptoms more precordial chest pain, but cannot exclude CAD. Plan: Screening evaluation with A Coronary Calcium Score.  Pending results, consider stress testing versus CTA-FFR.      Relevant Orders   ECHOCARDIOGRAM COMPLETE    CT CARDIAC SCORING   DOE (dyspnea on exertion)    Probably more related to deconditioning and lack of exercise, however with family history of heart failure that was not fully explained, we will check a 2D echo to get a better sense of her EF and valvular function.  Thankfully she did have an echo in the past that we can compare to. In the off chance that there is some potential diastolic dysfunction, I am adding Toprol to her losartan.  Seems euvolemic and therefore not likely to acute heart failure.      Relevant Orders   ECHOCARDIOGRAM COMPLETE   CT CARDIAC SCORING   Family history of CHF (congestive heart failure) (Chronic)   Relevant Orders   ECHOCARDIOGRAM COMPLETE   CT CARDIAC SCORING   Hyperlipidemia with target LDL less than 100 (Chronic)    Currently on low-dose atorvastatin.  Last echo lipids actually look very good.  She is to be continued.      Relevant Medications   atorvastatin (LIPITOR) 10 MG tablet   olmesartan-hydrochlorothiazide (BENICAR HCT) 20-12.5 MG tablet   metoprolol succinate (TOPROL XL) 25 MG 24 hr tablet   Other Relevant Orders   CT CARDIAC SCORING   EKG 12-Lead   Labile essential hypertension - Primary    Blood pressure looks stable today, but based on her anxiety and concern about what sounds like may be situational hypertension, I will add Toprol 25 mg daily.  She will take her on losartan in the morning and this in the evening      Relevant Medications   atorvastatin (LIPITOR) 10 MG tablet   olmesartan-hydrochlorothiazide (BENICAR HCT) 20-12.5 MG tablet   metoprolol succinate (TOPROL XL) 25 MG 24 hr tablet   Other Relevant Orders   CT CARDIAC SCORING   EKG 12-Lead    Other Visit Diagnoses    Precordial pain       Relevant Orders   CT CARDIAC SCORING      Current medicines are reviewed at length with the patient today. (+/- concerns) none The following changes have been made:See below  Patient Instructions  Your physician recommends that  you schedule a follow-up appointment in 1 month with DR Alexsus Papadopoulos. MEDICATION INSTRUCTIONS  TAKE METOPROLOL SUCCINATE ( TOPROL XL) 25 MG TAKE AT BEDTIME  TAKE BENICAR-HCTZ IN THE MORNING.  SCHEDULE AT [place] [address] Your physician has requested that you have an echocardiogram. Echocardiography is a painless test that uses sound waves to create images of your heart. It provides your doctor with information about the size and shape of your heart and how well your heart's chambers and valves are working. This procedure takes approximately  one hour. There are no restrictions for this procedure.  AND Your physician has requested that you have cardiac CT -CALCIUM SCORING.  This test is not covered by insurace. It will be a $150 charge.   Studies Ordered:   Orders Placed This Encounter  Procedures  . CT CARDIAC SCORING  . EKG 12-Lead  . ECHOCARDIOGRAM COMPLETE      Glenetta Hew, M.D., M.S. Interventional Cardiologist   Pager # [phone] Phone # [phone] [address] [place], [place] [address]   Thank you for choosing [facility]!!

## 2017-05-12 NOTE — Patient Instructions (Signed)
Your physician recommends that you schedule a follow-up appointment in 1 month with DR HARDING.    MEDICATION INSTRUCTIONS  TAKE METOPROLOL SUCCINATE ( TOPROL XL) 25 MG TAKE AT BEDTIME  TAKE BENICAR-HCTZ IN THE MORNING.     SCHEDULE AT [place] [address] Your physician has requested that you have an echocardiogram. Echocardiography is a painless test that uses sound waves to create images of your heart. It provides your doctor with information about the size and shape of your heart and how well your heart's chambers and valves are working. This procedure takes approximately one hour. There are no restrictions for this procedure.  AND Your physician has requested that you have cardiac CT -CALCIUM SCORING.  This test is not covered by insurace. It will be a $150 charge.  Coronary CalciumScan A coronary calcium scan is an imaging test used to look for deposits of calcium and other fatty materials (plaques) in the inner lining of the blood vessels of the heart (coronary arteries). These deposits of calcium and plaques can partly clog and narrow the coronary arteries without producing any symptoms or warning signs. This puts a person at risk for a heart attack. This test can detect these deposits before symptoms develop. Tell a health care provider about:  Any allergies you have.  All medicines you are taking, including vitamins, herbs, eye drops, creams, and over-the-counter medicines.  Any problems you or family members have had with anesthetic medicines.  Any blood disorders you have.  Any surgeries you have had.  Any medical conditions you have.  Whether you are pregnant or may be pregnant. What are the risks? Generally, this is a safe procedure. However, problems may occur, including:  Harm to a pregnant woman and her unborn baby. This test involves the use of radiation. Radiation exposure can be dangerous to a pregnant woman and her unborn baby. If you are  pregnant, you generally should not have this procedure done.  Slight increase in the risk of cancer. This is because of the radiation involved in the test. What happens before the procedure? No preparation is needed for this procedure. What happens during the procedure?  You will undress and remove any jewelry around your neck or chest.  You will put on a hospital gown.  Sticky electrodes will be placed on your chest. The electrodes will be connected to an electrocardiogram (ECG) machine to record a tracing of the electrical activity of your heart.  A CT scanner will take pictures of your heart. During this time, you will be asked to lie still and hold your breath for 2-3 seconds while a picture of your heart is being taken. The procedure may vary among health care providers and hospitals. What happens after the procedure?  You can get dressed.  You can return to your normal activities.  It is up to you to get the results of your test. Ask your health care provider, or the department that is doing the test, when your results will be ready. Summary  A coronary calcium scan is an imaging test used to look for deposits of calcium and other fatty materials (plaques) in the inner lining of the blood vessels of the heart (coronary arteries).  Generally, this is a safe procedure. Tell your health care provider if you are pregnant or may be pregnant.  No preparation is needed for this procedure.  A CT scanner will take pictures of your heart.  You can return to your normal  activities after the scan is done. This information is not intended to replace advice given to you by your health care provider. Make sure you discuss any questions you have with your health care provider. Document Released: 09/21/2007 Document Revised: 02/12/2016 Document Reviewed: 02/12/2016 Elsevier Interactive Patient Education  2017 [employer].

## 2017-05-14 ENCOUNTER — Encounter: Payer: Self-pay | Admitting: Cardiology

## 2017-05-14 NOTE — Assessment & Plan Note (Signed)
Currently on low-dose atorvastatin.  Last echo lipids actually look very good.  She is to be continued.

## 2017-05-14 NOTE — Assessment & Plan Note (Signed)
Blood pressure looks stable today, but based on her anxiety and concern about what sounds like may be situational hypertension, I will add Toprol 25 mg daily.  She will take her on losartan in the morning and this in the evening

## 2017-05-14 NOTE — Assessment & Plan Note (Signed)
Probably more related to deconditioning and lack of exercise, however with family history of heart failure that was not fully explained, we will check a 2D echo to get a better sense of her EF and valvular function.  Thankfully she did have an echo in the past that we can compare to. In the off chance that there is some potential diastolic dysfunction, I am adding Toprol to her losartan.  Seems euvolemic and therefore not likely to acute heart failure.

## 2017-05-14 NOTE — Assessment & Plan Note (Signed)
She has some symptoms that are somewhat atypical in nature do not really sound when actually described as being true exertional chest pain symptoms.  Related symptoms more precordial chest pain, but cannot exclude CAD. Plan: Screening evaluation with A Coronary Calcium Score.  Pending results, consider stress testing versus CTA-FFR.

## 2017-05-21 ENCOUNTER — Other Ambulatory Visit: Payer: Self-pay

## 2017-05-21 ENCOUNTER — Ambulatory Visit
Admission: RE | Admit: 2017-05-21 | Discharge: 2017-05-21 | Disposition: A | Discharge status: Home / Self Care | Payer: Self-pay | Source: Ambulatory Visit | Attending: Cardiology | Admitting: Cardiology

## 2017-05-21 ENCOUNTER — Ambulatory Visit (HOSPITAL_COMMUNITY): Payer: Medicare Other | Attending: Cardiovascular Disease

## 2017-05-21 DIAGNOSIS — R079 Chest pain, unspecified: Secondary | ICD-10-CM | POA: Diagnosis not present

## 2017-05-21 DIAGNOSIS — I10 Essential (primary) hypertension: Secondary | ICD-10-CM | POA: Diagnosis not present

## 2017-05-21 DIAGNOSIS — R0609 Other forms of dyspnea: Secondary | ICD-10-CM | POA: Diagnosis not present

## 2017-05-21 DIAGNOSIS — E785 Hyperlipidemia, unspecified: Secondary | ICD-10-CM

## 2017-05-21 DIAGNOSIS — I340 Nonrheumatic mitral (valve) insufficiency: Secondary | ICD-10-CM | POA: Diagnosis not present

## 2017-05-21 DIAGNOSIS — Z8249 Family history of ischemic heart disease and other diseases of the circulatory system: Secondary | ICD-10-CM

## 2017-05-21 DIAGNOSIS — R0600 Dyspnea, unspecified: Secondary | ICD-10-CM

## 2017-05-21 DIAGNOSIS — R072 Precordial pain: Secondary | ICD-10-CM

## 2017-05-21 NOTE — Progress Notes (Signed)
Echo results: Good news: Essentially normal echocardiogram and normal pump function and normal valve function.    EF: 60-65%. No regional wall motion abnormalities = No signs to suggest prior heart attack.Glenetta Hew, MD  Please forward to PCP: Schoenhoff, Altamese Cabal, MD

## 2017-06-04 DIAGNOSIS — M5416 Radiculopathy, lumbar region: Secondary | ICD-10-CM | POA: Diagnosis not present

## 2017-06-04 DIAGNOSIS — M5431 Sciatica, right side: Secondary | ICD-10-CM | POA: Diagnosis not present

## 2017-06-12 ENCOUNTER — Encounter: Payer: Self-pay | Admitting: Cardiology

## 2017-06-12 ENCOUNTER — Ambulatory Visit (INDEPENDENT_AMBULATORY_CARE_PROVIDER_SITE_OTHER): Payer: Medicare Other | Admitting: Cardiology

## 2017-06-12 VITALS — BP 124/68 | HR 77 | Ht 63.5 in | Wt 161.8 lb

## 2017-06-12 DIAGNOSIS — R0609 Other forms of dyspnea: Secondary | ICD-10-CM | POA: Diagnosis not present

## 2017-06-12 DIAGNOSIS — R079 Chest pain, unspecified: Secondary | ICD-10-CM | POA: Diagnosis not present

## 2017-06-12 DIAGNOSIS — E785 Hyperlipidemia, unspecified: Secondary | ICD-10-CM

## 2017-06-12 DIAGNOSIS — R0600 Dyspnea, unspecified: Secondary | ICD-10-CM

## 2017-06-12 NOTE — Patient Instructions (Signed)
NO CHANGE WITH CURRENT MEDICATIONS   Your physician wants you to follow-up in 12 MONTHS WITH DR HARDING. You will receive a reminder letter in the mail two months in advance. If you don't receive a letter, please call our office to schedule the follow-up appointment.     If you need a refill on your cardiac medications before your next appointment, please call your pharmacy.  

## 2017-06-12 NOTE — Progress Notes (Signed)
PCP: Lanice Shirts, MD  Clinic Note: Chief Complaint  Patient presents with  . Follow-up    Symptoms have notably improved.  Coronary calcium score and echo results.    HPI: April Terry is a 77 y.o. female with a PMH below who presents today for the evaluation of hypertension/exertional dyspnea with a family h/o CHF MRI is seen at the request of Schoenhoff, Altamese Cabal, MD  During her last visit with her PCP, she was noticing having some increasing levels of her blood pressure that she is somewhat concerned about.   April Terry was last seen for palpitations and cardiac risk evaluation back in 2013.  She did have an echocardiogram performed which did not show mitral prolapse.  Was otherwise normal as noted below.  Recent Hospitalizations: None  Studies Personally Reviewed - (if available, images/films reviewed: From Epic Chart or Care Everywhere)  Transthoracic Echo May 21, 2017: : Mild concentric hypertrophy.  EF 55-60%.  No regional wall motion normality.  No mitral prolapse noted.  Coronary calcium score: 0.  (February 2019)  Interval History: April Terry comes in today actually noting that she feels much better.  She has had a much better showing of her blood pressure since we wait a few adjustments last visit.  She is much less stressed and anxious.  She is no longer noting any exertional dyspnea or chest discomfort type symptoms.  She feels very reassured with the results of her echocardiogram and her blood pressure readings. She also denies any rapid irregular heartbeats or rapid palpitation -occasional "flip-flop" sensation, but much less frequent.  No syncope or near s syncope leepy, no TIA or amaurosis fugax. No chest pain or shortness of breath at rest or exertion. No claudication.  No PND, orthopnea or edema.  ROS: A comprehensive was performed. Review of Systems  Constitutional: Negative for malaise/fatigue (Energy actually feels like he is getting back to baseline)  and weight loss.  HENT: Negative for congestion and nosebleeds.   Respiratory: Negative for cough and shortness of breath (Notably improved).   Cardiovascular:       Per HPI  Gastrointestinal: Negative for abdominal pain, blood in stool, heartburn and melena.  Genitourinary: Negative for hematuria.  Musculoskeletal: Positive for back pain (Off and on) and joint pain (Baseline mild rheumatoid arthritis related pains).  Neurological: Positive for headaches. Negative for dizziness (Occasional positional), tingling (Pedal peripheral neuropathy) and weakness.  Psychiatric/Behavioral: Negative for depression. The patient is nervous/anxious.   All other systems reviewed and are negative.  I have reviewed and (if needed) personally updated the patient's problem list, medications, allergies, past medical and surgical history, social and family history.   Past Medical History:  Diagnosis Date  . Essential hypertension   . H/O polymyalgia rheumatica    Was symptoms have mostly resolved after prolonged treatment with steroids  . Hyperlipidemia    Well-controlled on fish oil and low-dose Lipitor.  . Rheumatoid arthritis involving both hands (April Terry)   . Situational mixed anxiety and depressive disorder     Past Surgical History:  Procedure Laterality Date  . ABDOMINAL HYSTERECTOMY  1984  . APPENDECTOMY  1950  . TRANSTHORACIC ECHOCARDIOGRAM  02/2012   Mild concentric hypertrophy.  EF 55-60%.  No regional wall motion normality.  No mitral prolapse noted.    Current Meds  Medication Sig  . atorvastatin (LIPITOR) 10 MG tablet Take 10 mg by mouth daily.  . Calcium Carb-Cholecalciferol (CALCIUM 1000 + D) 1000-800 MG-UNIT TABS Take 1 tablet by  mouth daily.  . Cholecalciferol (D3 HIGH POTENCY) 1000 units capsule Take 1,000 Units by mouth daily.  . Magnesium 500 MG TABS Take 1 tablet by mouth daily.  . metoprolol succinate (TOPROL XL) 25 MG 24 hr tablet Take 1 tablet (25 mg total) by mouth daily.  Marland Kitchen  olmesartan-hydrochlorothiazide (BENICAR HCT) 20-12.5 MG tablet Take 1 tablet by mouth daily.  . sertraline (ZOLOFT) 100 MG tablet Take 1 tablet by mouth daily.  Marland Kitchen sulfaSALAzine (AZULFIDINE) 500 MG EC tablet Take 4 tablets by mouth daily.    No Known Allergies  Social History   Tobacco Use  . Smoking status: Never Smoker  . Smokeless tobacco: Never Used  Substance Use Topics  . Alcohol use: Yes    Alcohol/week: 1.2 oz    Types: 2 Glasses of wine per week    Comment: Rare, occasional  . Drug use: No   Social History   Social History Narrative   Married April Terry) mother of 2, 3 grandchildren and 2 great-grandchildren.    family history includes Arthritis in her brother; Heart failure in her mother and sister; Obesity in her brother; Prostate cancer in her father.  Wt Readings from Last 3 Encounters:  06/12/17 161 lb 12.8 oz (73.4 kg)  05/12/17 163 lb 3.2 oz (74 kg)    PHYSICAL EXAM BP 124/68   Pulse 77   Ht 5' 3.5" (1.613 m)   Wt 161 lb 12.8 oz (73.4 kg)   SpO2 95%   BMI 28.21 kg/m  Physical Exam  Constitutional: She is oriented to person, place, and time. She appears well-developed and well-nourished. No distress.  Healthy-appearing.  Well-groomed.  HENT:  Head: Normocephalic and atraumatic.  Mouth/Throat: Oropharynx is clear and moist. No oropharyngeal exudate.  Neck: Neck supple. No hepatojugular reflux and no JVD present. Carotid bruit is not present. No tracheal deviation present. No thyromegaly present.  Cardiovascular: Normal rate, regular rhythm, normal heart sounds, intact distal pulses and normal pulses.  No extrasystoles are present. PMI is displaced (Rare). Exam reveals no gallop and no friction rub.  No murmur heard. Pulmonary/Chest: Effort normal and breath sounds normal. No respiratory distress. She has no rales.  Musculoskeletal: Normal range of motion. She exhibits no edema.  Neurological: She is alert and oriented to person, place, and time.    Psychiatric: She has a normal mood and affect. Her behavior is normal. Judgment and thought content normal.     Adult ECG Report No new EKG  Other studies Reviewed: Additional studies/ records that were reviewed today include:  Recent Labs: Pertinent labs from PCPs office July 2018: TC 141, TG 149, HDL 47, LDL 64.  TSH 3.8.  BUN/creatinine 11/0.73.  Potassium 3.6.  Normal LFTs.  Normal CBC.   ASSESSMENT / PLAN: Problem List Items Addressed This Visit    Hyperlipidemia with target LDL less than 100 (Chronic)    Remains on low-dose atorvastatin.  Followed by PCP.      DOE (dyspnea on exertion) - Primary    Probably more related to deconditioning and lack of exercise, but also could be related to the beta-blocker that we switch to nighttime.  Her symptoms have notably improved now with that brief change.  Her energy level is better.  Reassuring results on echo and coronary calcium score.  She is quite relieved.      Chest pain with low risk for cardiac etiology (Chronic)    With very low risk coronary calcium score, low likelihood of this being cardiac related  chest pain.  If recurs, would then go for more detailed evaluation with either Myoview or coronary CTA.        Current medicines are reviewed at length with the patient today. (+/- concerns) none The following changes have been made:See below  Patient Instructions  [place]     Your physician wants you to follow-up in [place].You will receive a reminder letter in the mail two months in advance. If you don't receive a letter, please call our office to schedule the follow-up appointment.   If you need a refill on your cardiac medications before your next appointment, please call your pharmacy.     Studies Ordered:   No orders of the defined types were placed in this encounter.     Glenetta Hew, M.D., M.S. Interventional Cardiologist   Pager # [phone] Phone #  [phone] [address] [place], [place] [address]   Thank you for choosing Heart[facility] at [facility]!!

## 2017-06-15 ENCOUNTER — Encounter: Payer: Self-pay | Admitting: Cardiology

## 2017-06-15 NOTE — Assessment & Plan Note (Signed)
Remains on low-dose atorvastatin.  Followed by PCP.

## 2017-06-15 NOTE — Assessment & Plan Note (Signed)
With very low risk coronary calcium score, low likelihood of this being cardiac related chest pain.  If recurs, would then go for more detailed evaluation with either Myoview or coronary CTA.

## 2017-06-15 NOTE — Assessment & Plan Note (Signed)
Probably more related to deconditioning and lack of exercise, but also could be related to the beta-blocker that we switch to nighttime.  Her symptoms have notably improved now with that brief change.  Her energy level is better.  Reassuring results on echo and coronary calcium score.  She is quite relieved.

## 2017-06-16 DIAGNOSIS — M47816 Spondylosis without myelopathy or radiculopathy, lumbar region: Secondary | ICD-10-CM | POA: Diagnosis not present

## 2017-07-23 DIAGNOSIS — M7072 Other bursitis of hip, left hip: Secondary | ICD-10-CM | POA: Diagnosis not present

## 2017-07-23 DIAGNOSIS — E663 Overweight: Secondary | ICD-10-CM | POA: Diagnosis not present

## 2017-07-23 DIAGNOSIS — M0589 Other rheumatoid arthritis with rheumatoid factor of multiple sites: Secondary | ICD-10-CM | POA: Diagnosis not present

## 2017-07-23 DIAGNOSIS — M150 Primary generalized (osteo)arthritis: Secondary | ICD-10-CM | POA: Diagnosis not present

## 2017-07-23 DIAGNOSIS — Z79899 Other long term (current) drug therapy: Secondary | ICD-10-CM | POA: Diagnosis not present

## 2017-07-23 DIAGNOSIS — Z6827 Body mass index (BMI) 27.0-27.9, adult: Secondary | ICD-10-CM | POA: Diagnosis not present

## 2017-07-23 DIAGNOSIS — M353 Polymyalgia rheumatica: Secondary | ICD-10-CM | POA: Diagnosis not present

## 2017-07-28 DIAGNOSIS — I10 Essential (primary) hypertension: Secondary | ICD-10-CM | POA: Diagnosis not present

## 2017-07-28 DIAGNOSIS — M5431 Sciatica, right side: Secondary | ICD-10-CM | POA: Diagnosis not present

## 2017-07-28 DIAGNOSIS — R05 Cough: Secondary | ICD-10-CM | POA: Diagnosis not present

## 2017-08-18 DIAGNOSIS — I10 Essential (primary) hypertension: Secondary | ICD-10-CM | POA: Diagnosis not present

## 2017-08-18 DIAGNOSIS — M069 Rheumatoid arthritis, unspecified: Secondary | ICD-10-CM | POA: Diagnosis not present

## 2017-09-02 ENCOUNTER — Telehealth: Payer: Self-pay | Admitting: Cardiology

## 2017-09-02 NOTE — Telephone Encounter (Signed)
New Message:   Pt says she would like to stop taking Metoprolol,she will continue taking her blood pressure in the morning.She is coughing, eye irriattion,tiredness and mind is foggy. All these symptoms started a month after she started taking Metoprolol.

## 2017-09-02 NOTE — Telephone Encounter (Signed)
Spoke to patient . She states she has been having  cough , eye irritation , itchy throat , feeling foggy .she  States when she was in the office in [facility] , she had the symptoms but she contributed to  seaonal allergies, but  The symptoms are still present, only new medication is the toprol xl  ( at bedtime), patient has been using  olmesartan-hctz for more than 6 six years.  Patient states she will not take toprol xl tonight. Patient aware will deer to DR [place], contact her back

## 2017-09-02 NOTE — Telephone Encounter (Signed)
PATIENT  AWARE WILL HOLD MEDICATION FOR WEEK  AND WILL LET OFFICE KNOW THE OUTCOME.

## 2017-09-02 NOTE — Telephone Encounter (Signed)
Probably not the reason for her symptoms, however she wants to try to give it a week without the medicine.  If no improvement, then it is not related to Toprol.  Glenetta Hew, MD

## 2017-09-03 DIAGNOSIS — H0012 Chalazion right lower eyelid: Secondary | ICD-10-CM | POA: Diagnosis not present

## 2017-09-03 DIAGNOSIS — H0011 Chalazion right upper eyelid: Secondary | ICD-10-CM | POA: Diagnosis not present

## 2017-09-03 DIAGNOSIS — H40051 Ocular hypertension, right eye: Secondary | ICD-10-CM | POA: Diagnosis not present

## 2017-09-10 DIAGNOSIS — H0100B Unspecified blepharitis left eye, upper and lower eyelids: Secondary | ICD-10-CM | POA: Diagnosis not present

## 2017-09-10 DIAGNOSIS — H0012 Chalazion right lower eyelid: Secondary | ICD-10-CM | POA: Diagnosis not present

## 2017-09-10 DIAGNOSIS — H0011 Chalazion right upper eyelid: Secondary | ICD-10-CM | POA: Diagnosis not present

## 2017-09-10 DIAGNOSIS — H0100A Unspecified blepharitis right eye, upper and lower eyelids: Secondary | ICD-10-CM | POA: Diagnosis not present

## 2017-09-24 DIAGNOSIS — H01001 Unspecified blepharitis right upper eyelid: Secondary | ICD-10-CM | POA: Diagnosis not present

## 2017-09-24 DIAGNOSIS — H0012 Chalazion right lower eyelid: Secondary | ICD-10-CM | POA: Diagnosis not present

## 2017-09-24 DIAGNOSIS — H0011 Chalazion right upper eyelid: Secondary | ICD-10-CM | POA: Diagnosis not present

## 2017-09-24 DIAGNOSIS — H01002 Unspecified blepharitis right lower eyelid: Secondary | ICD-10-CM | POA: Diagnosis not present

## 2017-10-21 DIAGNOSIS — E7800 Pure hypercholesterolemia, unspecified: Secondary | ICD-10-CM | POA: Diagnosis not present

## 2017-10-21 DIAGNOSIS — Z1389 Encounter for screening for other disorder: Secondary | ICD-10-CM | POA: Diagnosis not present

## 2017-10-21 DIAGNOSIS — I10 Essential (primary) hypertension: Secondary | ICD-10-CM | POA: Diagnosis not present

## 2017-10-21 DIAGNOSIS — M069 Rheumatoid arthritis, unspecified: Secondary | ICD-10-CM | POA: Diagnosis not present

## 2017-10-21 DIAGNOSIS — Z0000 Encounter for general adult medical examination without abnormal findings: Secondary | ICD-10-CM | POA: Diagnosis not present

## 2017-10-21 DIAGNOSIS — E559 Vitamin D deficiency, unspecified: Secondary | ICD-10-CM | POA: Diagnosis not present

## 2017-10-21 DIAGNOSIS — L989 Disorder of the skin and subcutaneous tissue, unspecified: Secondary | ICD-10-CM | POA: Diagnosis not present

## 2017-11-11 DIAGNOSIS — H52203 Unspecified astigmatism, bilateral: Secondary | ICD-10-CM | POA: Diagnosis not present

## 2017-11-11 DIAGNOSIS — H524 Presbyopia: Secondary | ICD-10-CM | POA: Diagnosis not present

## 2017-11-11 DIAGNOSIS — H2513 Age-related nuclear cataract, bilateral: Secondary | ICD-10-CM | POA: Diagnosis not present

## 2017-11-19 DIAGNOSIS — D225 Melanocytic nevi of trunk: Secondary | ICD-10-CM | POA: Diagnosis not present

## 2017-11-19 DIAGNOSIS — L820 Inflamed seborrheic keratosis: Secondary | ICD-10-CM | POA: Diagnosis not present

## 2017-11-26 DIAGNOSIS — M353 Polymyalgia rheumatica: Secondary | ICD-10-CM | POA: Diagnosis not present

## 2017-11-26 DIAGNOSIS — E663 Overweight: Secondary | ICD-10-CM | POA: Diagnosis not present

## 2017-11-26 DIAGNOSIS — M150 Primary generalized (osteo)arthritis: Secondary | ICD-10-CM | POA: Diagnosis not present

## 2017-11-26 DIAGNOSIS — M0589 Other rheumatoid arthritis with rheumatoid factor of multiple sites: Secondary | ICD-10-CM | POA: Diagnosis not present

## 2017-11-26 DIAGNOSIS — Z79899 Other long term (current) drug therapy: Secondary | ICD-10-CM | POA: Diagnosis not present

## 2017-11-26 DIAGNOSIS — Z6828 Body mass index (BMI) 28.0-28.9, adult: Secondary | ICD-10-CM | POA: Diagnosis not present

## 2017-11-26 DIAGNOSIS — M7072 Other bursitis of hip, left hip: Secondary | ICD-10-CM | POA: Diagnosis not present

## 2017-12-02 DIAGNOSIS — D7589 Other specified diseases of blood and blood-forming organs: Secondary | ICD-10-CM | POA: Diagnosis not present

## 2017-12-02 DIAGNOSIS — E559 Vitamin D deficiency, unspecified: Secondary | ICD-10-CM | POA: Diagnosis not present

## 2017-12-02 DIAGNOSIS — Z6379 Other stressful life events affecting family and household: Secondary | ICD-10-CM | POA: Diagnosis not present

## 2017-12-30 DIAGNOSIS — Z6828 Body mass index (BMI) 28.0-28.9, adult: Secondary | ICD-10-CM | POA: Diagnosis not present

## 2017-12-30 DIAGNOSIS — M7072 Other bursitis of hip, left hip: Secondary | ICD-10-CM | POA: Diagnosis not present

## 2017-12-30 DIAGNOSIS — M0589 Other rheumatoid arthritis with rheumatoid factor of multiple sites: Secondary | ICD-10-CM | POA: Diagnosis not present

## 2017-12-30 DIAGNOSIS — Z79899 Other long term (current) drug therapy: Secondary | ICD-10-CM | POA: Diagnosis not present

## 2017-12-30 DIAGNOSIS — M25561 Pain in right knee: Secondary | ICD-10-CM | POA: Diagnosis not present

## 2017-12-30 DIAGNOSIS — M353 Polymyalgia rheumatica: Secondary | ICD-10-CM | POA: Diagnosis not present

## 2017-12-30 DIAGNOSIS — E663 Overweight: Secondary | ICD-10-CM | POA: Diagnosis not present

## 2017-12-30 DIAGNOSIS — M150 Primary generalized (osteo)arthritis: Secondary | ICD-10-CM | POA: Diagnosis not present

## 2018-03-03 DIAGNOSIS — I10 Essential (primary) hypertension: Secondary | ICD-10-CM | POA: Diagnosis not present

## 2018-03-03 DIAGNOSIS — M069 Rheumatoid arthritis, unspecified: Secondary | ICD-10-CM | POA: Diagnosis not present

## 2018-04-20 DIAGNOSIS — Z23 Encounter for immunization: Secondary | ICD-10-CM | POA: Diagnosis not present

## 2018-04-22 DIAGNOSIS — M353 Polymyalgia rheumatica: Secondary | ICD-10-CM | POA: Diagnosis not present

## 2018-04-22 DIAGNOSIS — Z79899 Other long term (current) drug therapy: Secondary | ICD-10-CM | POA: Diagnosis not present

## 2018-04-22 DIAGNOSIS — M150 Primary generalized (osteo)arthritis: Secondary | ICD-10-CM | POA: Diagnosis not present

## 2018-04-22 DIAGNOSIS — Z6828 Body mass index (BMI) 28.0-28.9, adult: Secondary | ICD-10-CM | POA: Diagnosis not present

## 2018-04-22 DIAGNOSIS — M25561 Pain in right knee: Secondary | ICD-10-CM | POA: Diagnosis not present

## 2018-04-22 DIAGNOSIS — E663 Overweight: Secondary | ICD-10-CM | POA: Diagnosis not present

## 2018-04-22 DIAGNOSIS — M0589 Other rheumatoid arthritis with rheumatoid factor of multiple sites: Secondary | ICD-10-CM | POA: Diagnosis not present

## 2018-04-26 ENCOUNTER — Other Ambulatory Visit: Payer: Self-pay | Admitting: Cardiology

## 2018-04-27 NOTE — Telephone Encounter (Signed)
Rx has been sent to the pharmacy electronically. ° °

## 2018-04-28 DIAGNOSIS — I10 Essential (primary) hypertension: Secondary | ICD-10-CM | POA: Diagnosis not present

## 2018-04-28 DIAGNOSIS — Z23 Encounter for immunization: Secondary | ICD-10-CM | POA: Diagnosis not present

## 2018-04-28 DIAGNOSIS — Z6379 Other stressful life events affecting family and household: Secondary | ICD-10-CM | POA: Diagnosis not present

## 2018-04-28 DIAGNOSIS — D7589 Other specified diseases of blood and blood-forming organs: Secondary | ICD-10-CM | POA: Diagnosis not present

## 2018-04-29 DIAGNOSIS — I10 Essential (primary) hypertension: Secondary | ICD-10-CM | POA: Diagnosis not present

## 2018-04-29 DIAGNOSIS — M069 Rheumatoid arthritis, unspecified: Secondary | ICD-10-CM | POA: Diagnosis not present

## 2018-05-30 ENCOUNTER — Other Ambulatory Visit: Payer: Self-pay

## 2018-05-30 ENCOUNTER — Ambulatory Visit (INDEPENDENT_AMBULATORY_CARE_PROVIDER_SITE_OTHER): Payer: Medicare Other

## 2018-05-30 ENCOUNTER — Encounter (HOSPITAL_COMMUNITY): Payer: Self-pay | Admitting: *Deleted

## 2018-05-30 ENCOUNTER — Ambulatory Visit (HOSPITAL_COMMUNITY)
Admission: EM | Admit: 2018-05-30 | Discharge: 2018-05-30 | Disposition: A | Discharge status: Home / Self Care | Payer: Medicare Other | Attending: Family Medicine | Admitting: Family Medicine

## 2018-05-30 DIAGNOSIS — S92515A Nondisplaced fracture of proximal phalanx of left lesser toe(s), initial encounter for closed fracture: Secondary | ICD-10-CM | POA: Diagnosis not present

## 2018-05-30 DIAGNOSIS — W19XXXA Unspecified fall, initial encounter: Secondary | ICD-10-CM

## 2018-05-30 DIAGNOSIS — S92511A Displaced fracture of proximal phalanx of right lesser toe(s), initial encounter for closed fracture: Secondary | ICD-10-CM | POA: Diagnosis not present

## 2018-05-30 DIAGNOSIS — M79675 Pain in left toe(s): Secondary | ICD-10-CM

## 2018-05-30 NOTE — ED Provider Notes (Signed)
[place]    CSN: [id number] Arrival date & time: 05/30/18  1038     History   Chief Complaint Chief Complaint  Patient presents with  . Toe Pain    HPI April Terry is a 78 y.o. female.   Denies presents with complaints of left foot toe pain after trip and fall two days ago. She was walking and not looking, tripped while not wearing shoes in her home causing her to fall. Her toes bent as she tripped. She had bleeding from skin tear and has had pain since. No change in sensation- history of neuropathy. Pain with activity on the foot. Hit head on her rug when she fell, did not lose consciousness. No dizziness, nausea or vomiting. No further headache. No other injuries or sites of pain. Denies any previous injury to her foot or toes. She follows with rheumatology for RA. Hx of htn, hyperlipidemia.   ROS per HPI.      Past Medical History:  Diagnosis Date  . Essential hypertension   . H/O polymyalgia rheumatica    Was symptoms have mostly resolved after prolonged treatment with steroids  . Hyperlipidemia    Well-controlled on fish oil and low-dose Lipitor.  . Rheumatoid arthritis involving both hands ([place])   . Situational mixed anxiety and depressive disorder     Patient Active Problem List   Diagnosis Date Noted  . Labile essential hypertension 05/12/2017  . Hyperlipidemia with target LDL less than 100 05/12/2017  . Family history of CHF (congestive heart failure) 05/12/2017  . Chest pain with low risk for cardiac etiology 05/12/2017  . DOE (dyspnea on exertion) 05/12/2017    Past Surgical History:  Procedure Laterality Date  . ABDOMINAL HYSTERECTOMY  1984  . APPENDECTOMY  1950  . TRANSTHORACIC ECHOCARDIOGRAM  02/2012   Mild concentric hypertrophy.  EF 55-60%.  No regional wall motion normality.  No mitral prolapse noted.    OB History   No obstetric history on file.      Home Medications    Prior to Admission medications   Medication Sig  Start Date End Date Taking? Authorizing Provider  atorvastatin (LIPITOR) 10 MG tablet Take 10 mg by mouth daily.    [provider]  Calcium Carb-Cholecalciferol (CALCIUM 1000 + D) 1000-800 MG-UNIT TABS Take 1 tablet by mouth daily.    [provider]  Cholecalciferol (D3 HIGH POTENCY) 1000 units capsule Take 1,000 Units by mouth daily.    [provider]  Magnesium 500 MG TABS Take 1 tablet by mouth daily.    [provider]  metoprolol succinate (TOPROL-XL) 25 MG 24 hr tablet TAKE 1 TABLET(25 MG) BY MOUTH DAILY 04/27/18   Leonie Man, MD  olmesartan-hydrochlorothiazide (BENICAR HCT) 20-12.5 MG tablet Take 1 tablet by mouth daily.    [provider]  sertraline (ZOLOFT) 100 MG tablet Take 1 tablet by mouth daily. 02/21/17   [provider]  sulfaSALAzine (AZULFIDINE) 500 MG EC tablet Take 4 tablets by mouth daily. 04/21/17   [provider]    Family History Family History  Problem Relation Age of Onset  . Heart failure Mother   . Prostate cancer Father   . Heart failure Sister   . Arthritis Brother   . Obesity Brother     Social History Social History   Tobacco Use  . Smoking status: Never Smoker  . Smokeless tobacco: Never Used  Substance Use Topics  . Alcohol use: Yes    Alcohol/week:  2.0 standard drinks    Types: 2 Glasses of wine per week    Comment: Rare, occasional  . Drug use: No     Allergies   Patient has no known allergies.   Review of Systems Review of Systems   Physical Exam Triage Vital Signs ED Triage Vitals  Enc Vitals Group     BP 05/30/18 1131 (!) 120/54     Pulse Rate 05/30/18 1131 75     Resp 05/30/18 1131 18     Temp 05/30/18 1131 97.9 F (36.6 C)     Temp src --      SpO2 05/30/18 1131 100 %     Weight --      Height --      Head Circumference --      Peak Flow --      Pain Score 05/30/18 1132 6     Pain Loc --      Pain Edu? --      Excl. in [place]? --    No data  found.  Updated Vital Signs BP (!) 120/54 (BP Location: Right Arm)   Pulse 75   Temp 97.9 F (36.6 C)   Resp 18   SpO2 100%   Visual Acuity Right Eye Distance:   Left Eye Distance:   Bilateral Distance:    Right Eye Near:   Left Eye Near:    Bilateral Near:     Physical Exam Constitutional:      General: She is not in acute distress.    Appearance: She is well-developed.  Cardiovascular:     Rate and Rhythm: Normal rate and regular rhythm.     Heart sounds: Normal heart sounds.  Pulmonary:     Effort: Pulmonary effort is normal.     Breath sounds: Normal breath sounds.  Musculoskeletal:     Left foot: Decreased range of motion. Normal capillary refill. Tenderness, bony tenderness and swelling present. No crepitus, deformity or laceration.     Comments: Skin tear at medial proximal base of left foot pinky toe, onlyh approximately 1-2 mm in depth with entire depth visualized; no surrounding redness, no active bleeding or drainage; tenderness to entire pinky toe on palpation, with noted bruising and swelling to proximal aspect of 4th and 5th toes at MTP joints; mild pain with flexion and extension of 4th and 5th MTP joints; cap refill < 2 seconds; gross sensation limited, states unchanged from baseline neuropathy   Skin:    General: Skin is warm and dry.  Neurological:     Mental Status: She is alert and oriented to person, place, and time.      UC Treatments / Results  Labs (all labs ordered are listed, but only abnormal results are displayed) Labs Reviewed - No data to display  EKG None  Radiology Dg Foot Complete Left  Result Date: 05/30/2018 CLINICAL DATA:  Fall 3 weeks ago with pain at fourth and fifth phalanges. EXAM: LEFT FOOT - COMPLETE 3+ VIEW COMPARISON:  None. FINDINGS: Mild dorsal soft tissue swelling about the forefoot and midfoot. Minimally displaced, intra-articular fractures involving the proximal portion the proximal phalanges of the fourth and fifth  digits medially. IMPRESSION: Fractures of the proximal portion of the proximal phalanges of the fourth and fifth digits. Electronically Signed   By: Abigail Miyamoto M.D.   On: 05/30/2018 12:05    Procedures Procedures (including critical care time)  Medications Ordered in UC Medications - No data to display  Initial Impression /  Assessment and Plan / UC Course  I have reviewed the triage vital signs and the nursing notes.  Pertinent labs & imaging results that were available during my care of the patient were reviewed by me and considered in my medical decision making (see chart for details).     Superficial skin tear from stretching of left pinky toe with fall; 4th and 5th proximal phalange fractures of left foot toes. Post op shoe provided. Wound care and pain management discussed. Follow up with orthopedics and/or podiatry. Encouraged strict wound care and evaluation as neuropathy may limit sensation. Patient verbalized understanding and agreeable to plan.  Ambulatory out of clinic without difficulty.   Final Clinical Impressions(s) / UC Diagnoses   Final diagnoses:  Closed nondisplaced fracture of proximal phalanx of lesser toe of left foot, initial encounter     Discharge Instructions     It does appear that you have fractures to your 4th and 5th toes only.  Use of walking shoe as provided.  Please follow up with orthopedics and/or podiatry for further management.  Cleanse wound daily with soap and water, keep dry as able and keep clean.  If develop signs of redness, drainage or increased pain to skin injury please return to be seen. Please assess daily as your neuropathy may limit your pain sensation with infection.  May take your meloxicam daily to help with pain.  Ice and elevation as well.    ED Prescriptions    None     Controlled Substance Prescriptions [place] Controlled Substance Registry consulted? Not Applicable   Zigmund Gottron, NP 05/30/18 1223

## 2018-05-30 NOTE — ED Triage Notes (Signed)
States she fell lost balance and fell. C/o pain left 4th and 5 th to hurting , states something punctured something between her toes.

## 2018-05-30 NOTE — Discharge Instructions (Addendum)
It does appear that you have fractures to your 4th and 5th toes only.  Use of walking shoe as provided.  Please follow up with orthopedics and/or podiatry for further management.  Cleanse wound daily with soap and water, keep dry as able and keep clean.  If develop signs of redness, drainage or increased pain to skin injury please return to be seen. Please assess daily as your neuropathy may limit your pain sensation with infection.  May take your meloxicam daily to help with pain.  Ice and elevation as well.

## 2018-06-17 ENCOUNTER — Other Ambulatory Visit: Payer: Self-pay | Admitting: Podiatry

## 2018-06-17 ENCOUNTER — Ambulatory Visit (INDEPENDENT_AMBULATORY_CARE_PROVIDER_SITE_OTHER): Payer: Medicare Other | Admitting: Podiatry

## 2018-06-17 ENCOUNTER — Other Ambulatory Visit: Payer: Self-pay

## 2018-06-17 ENCOUNTER — Ambulatory Visit (INDEPENDENT_AMBULATORY_CARE_PROVIDER_SITE_OTHER): Payer: Medicare Other

## 2018-06-17 ENCOUNTER — Encounter: Payer: Self-pay | Admitting: Podiatry

## 2018-06-17 VITALS — BP 93/50

## 2018-06-17 DIAGNOSIS — M79672 Pain in left foot: Secondary | ICD-10-CM | POA: Diagnosis not present

## 2018-06-17 DIAGNOSIS — G629 Polyneuropathy, unspecified: Secondary | ICD-10-CM

## 2018-06-17 DIAGNOSIS — S92402D Displaced unspecified fracture of left great toe, subsequent encounter for fracture with routine healing: Secondary | ICD-10-CM

## 2018-06-17 NOTE — Progress Notes (Signed)
Subjective:   Patient ID: April Terry, female   DOB: 78 y.o.   MRN: [id number]   HPI Patient presents with fractures of digits 234 and 5 over the left foot after having had injury and states it is been approximately 2 weeks and gradually improving but still sore.  Patient does not smoke likes to be active   Review of Systems  All other systems reviewed and are negative.       Objective:  Physical Exam Vitals signs and nursing note reviewed.  Constitutional:      Appearance: She is well-developed.  Pulmonary:     Effort: Pulmonary effort is normal.  Musculoskeletal: Normal range of motion.  Skin:    General: Skin is warm.  Neurological:     Mental Status: She is alert.     Neurovascular status intact muscle strength is adequate range of motion within normal limits with patient found to have edema in the forefoot left and pain at the base of the digits 2345 with the left second showing some malposition.  Patient is noted to have good digital perfusion well oriented x3     Assessment:  Digital deformity with fracture of the lesser digits left     Plan:  H&P x-rays reviewed and at this point we will continue with compression elevation and I explained ultimately surgery may be necessary to remove fragments of bone.  Patient understands total recovery will take about 12 weeks and will be seen back as needed  X-ray indicates there is fracture of the base of digits 345 left and head of the proximal phalanx digit to left

## 2018-08-21 ENCOUNTER — Other Ambulatory Visit: Payer: Self-pay | Admitting: Cardiology

## 2018-08-21 NOTE — Telephone Encounter (Signed)
Metoprolol succ 25 mg refilled. 

## 2018-09-28 ENCOUNTER — Other Ambulatory Visit: Payer: Self-pay | Admitting: Internal Medicine

## 2018-09-28 DIAGNOSIS — Z1231 Encounter for screening mammogram for malignant neoplasm of breast: Secondary | ICD-10-CM

## 2018-10-21 DIAGNOSIS — E663 Overweight: Secondary | ICD-10-CM | POA: Diagnosis not present

## 2018-10-21 DIAGNOSIS — M25561 Pain in right knee: Secondary | ICD-10-CM | POA: Diagnosis not present

## 2018-10-21 DIAGNOSIS — M0589 Other rheumatoid arthritis with rheumatoid factor of multiple sites: Secondary | ICD-10-CM | POA: Diagnosis not present

## 2018-10-21 DIAGNOSIS — M353 Polymyalgia rheumatica: Secondary | ICD-10-CM | POA: Diagnosis not present

## 2018-10-21 DIAGNOSIS — M150 Primary generalized (osteo)arthritis: Secondary | ICD-10-CM | POA: Diagnosis not present

## 2018-10-21 DIAGNOSIS — Z6828 Body mass index (BMI) 28.0-28.9, adult: Secondary | ICD-10-CM | POA: Diagnosis not present

## 2018-10-21 DIAGNOSIS — Z79899 Other long term (current) drug therapy: Secondary | ICD-10-CM | POA: Diagnosis not present

## 2018-11-02 DIAGNOSIS — I10 Essential (primary) hypertension: Secondary | ICD-10-CM | POA: Diagnosis not present

## 2018-11-02 DIAGNOSIS — E7800 Pure hypercholesterolemia, unspecified: Secondary | ICD-10-CM | POA: Diagnosis not present

## 2018-11-02 DIAGNOSIS — M069 Rheumatoid arthritis, unspecified: Secondary | ICD-10-CM | POA: Diagnosis not present

## 2018-11-04 ENCOUNTER — Other Ambulatory Visit: Payer: Self-pay

## 2018-11-04 ENCOUNTER — Ambulatory Visit
Admission: RE | Admit: 2018-11-04 | Discharge: 2018-11-04 | Disposition: A | Discharge status: Home / Self Care | Payer: Medicare Other | Source: Ambulatory Visit | Attending: Internal Medicine | Admitting: Internal Medicine

## 2018-11-04 DIAGNOSIS — Z1231 Encounter for screening mammogram for malignant neoplasm of breast: Secondary | ICD-10-CM

## 2018-11-09 DIAGNOSIS — Z0000 Encounter for general adult medical examination without abnormal findings: Secondary | ICD-10-CM | POA: Diagnosis not present

## 2018-11-09 DIAGNOSIS — E7800 Pure hypercholesterolemia, unspecified: Secondary | ICD-10-CM | POA: Diagnosis not present

## 2018-11-09 DIAGNOSIS — Z23 Encounter for immunization: Secondary | ICD-10-CM | POA: Diagnosis not present

## 2018-11-09 DIAGNOSIS — Z1389 Encounter for screening for other disorder: Secondary | ICD-10-CM | POA: Diagnosis not present

## 2018-11-09 DIAGNOSIS — I10 Essential (primary) hypertension: Secondary | ICD-10-CM | POA: Diagnosis not present

## 2018-11-09 DIAGNOSIS — E559 Vitamin D deficiency, unspecified: Secondary | ICD-10-CM | POA: Diagnosis not present

## 2018-11-09 DIAGNOSIS — D7589 Other specified diseases of blood and blood-forming organs: Secondary | ICD-10-CM | POA: Diagnosis not present

## 2018-11-09 DIAGNOSIS — F4323 Adjustment disorder with mixed anxiety and depressed mood: Secondary | ICD-10-CM | POA: Diagnosis not present

## 2018-11-09 DIAGNOSIS — M069 Rheumatoid arthritis, unspecified: Secondary | ICD-10-CM | POA: Diagnosis not present

## 2018-11-09 DIAGNOSIS — M353 Polymyalgia rheumatica: Secondary | ICD-10-CM | POA: Diagnosis not present

## 2018-11-18 DIAGNOSIS — H2513 Age-related nuclear cataract, bilateral: Secondary | ICD-10-CM | POA: Diagnosis not present

## 2018-11-18 DIAGNOSIS — H52203 Unspecified astigmatism, bilateral: Secondary | ICD-10-CM | POA: Diagnosis not present

## 2018-12-07 ENCOUNTER — Ambulatory Visit: Payer: Medicare Other | Admitting: Cardiology

## 2018-12-09 ENCOUNTER — Ambulatory Visit: Payer: Medicare Other | Admitting: Cardiology

## 2019-02-08 ENCOUNTER — Other Ambulatory Visit: Payer: Self-pay

## 2019-02-08 ENCOUNTER — Encounter: Payer: Self-pay | Admitting: Cardiology

## 2019-02-08 ENCOUNTER — Ambulatory Visit (INDEPENDENT_AMBULATORY_CARE_PROVIDER_SITE_OTHER): Payer: Medicare Other | Admitting: Cardiology

## 2019-02-08 VITALS — BP 102/57 | HR 73 | Ht 64.0 in | Wt 167.0 lb

## 2019-02-08 DIAGNOSIS — R0609 Other forms of dyspnea: Secondary | ICD-10-CM

## 2019-02-08 DIAGNOSIS — I10 Essential (primary) hypertension: Secondary | ICD-10-CM

## 2019-02-08 DIAGNOSIS — Z23 Encounter for immunization: Secondary | ICD-10-CM | POA: Diagnosis not present

## 2019-02-08 DIAGNOSIS — R0600 Dyspnea, unspecified: Secondary | ICD-10-CM

## 2019-02-08 DIAGNOSIS — R002 Palpitations: Secondary | ICD-10-CM | POA: Diagnosis not present

## 2019-02-08 MED ORDER — METOPROLOL SUCCINATE ER 25 MG PO TB24: 25.0000 mg | ORAL_TABLET | ORAL | 0 refills | Status: DC | PRN

## 2019-02-08 NOTE — Progress Notes (Signed)
PCP: Leeroy Cha, MD  Clinic Note: Chief Complaint  Patient presents with   Follow-up   Palpitations    HPI:    April Terry is a 78 y.o. female with a PMH below who presents today for delayed annual f/u for palpitations & HTN.  April Terry was last seen in March 2019 - evaluation of DOE & HTN - Echo normal & Cor Calcium Score Zero.  Recent Hospitalizations:   Feb 2020: Fall with left foot injury.  Reviewed  CV studies:    The following studies were reviewed today: (if available, images/films reviewed: From Epic Chart or Care Everywhere)  none   Interval History:   April Terry returns here today overall doing well from a cardiac standpoint.  She really has not noticed that much of the way of any type of palpitations or irregular heartbeats recently.  Pretty much the last 6 months have been pretty benign when it comes any palpitations or irregular heartbeats.  No lightheadedness or dizziness.  No wooziness.  She does have some labile blood pressures where her pressures will go up some and then down again.  Currently 102/57.  She would like to see if she can potentially stop the beta-blocker and interested potentially get off the medication especially with her blood pressure being a little lower now.  A little bit of fatigue but nothing significant.  Cardiovascular review of symptoms (summary): no chest pain or dyspnea on exertion negative for - edema, irregular heartbeat, orthopnea, palpitations, paroxysmal nocturnal dyspnea, rapid heart rate or shortness of breath, syncope/near syncope, TIA/amaurosis fugax.  Claudication  The patient does not have symptoms concerning for COVID-19 infection (fever, chills, cough, or new shortness of breath).  The patient is practicing social distancing. ++ Masking.  She does the shopping for the family-- groceries/shopping.    REVIEWED OF SYSTEMS   ROS: A comprehensive was performed. Review of Systems  Constitutional:  Negative for malaise/fatigue.  HENT: Negative for congestion and nosebleeds.   Respiratory: Negative for cough and shortness of breath.   Gastrointestinal: Negative for blood in stool and melena.  Genitourinary: Negative for hematuria.  Musculoskeletal: Negative for falls.  Neurological: Negative.   Psychiatric/Behavioral: Negative.     I have reviewed and (if needed) personally updated the patient's problem list, medications, allergies, past medical and surgical history, social and family history.   PAST MEDICAL HISTORY   Past Medical History:  Diagnosis Date   Essential hypertension    H/O polymyalgia rheumatica    Was symptoms have mostly resolved after prolonged treatment with steroids   Hyperlipidemia    Well-controlled on fish oil and low-dose Lipitor.   Rheumatoid arthritis involving both hands ([place])    Situational mixed anxiety and depressive disorder      PAST SURGICAL HISTORY   Past Surgical History:  Procedure Laterality Date   ABDOMINAL HYSTERECTOMY  1984   APPENDECTOMY  1950   TRANSTHORACIC ECHOCARDIOGRAM  02/2012   Mild concentric hypertrophy.  EF 55-60%.  No regional wall motion normality.  No mitral prolapse noted.     MEDICATIONS/ALLERGIES   Current Meds  Medication Sig   atorvastatin (LIPITOR) 10 MG tablet Take 10 mg by mouth daily.   Calcium Carb-Cholecalciferol (CALCIUM 1000 + D) 1000-800 MG-UNIT TABS Take 1 tablet by mouth daily.   Cholecalciferol (D3 HIGH POTENCY) 1000 units capsule Take 1,000 Units by mouth daily.   irbesartan-hydrochlorothiazide (AVALIDE) 150-12.5 MG tablet TK 1 T PO QD IN THE EVE   Magnesium 500 MG TABS  Take 1 tablet by mouth daily.   meloxicam (MOBIC) 15 MG tablet TK 1 T PO ONCE A DAY WF   metoprolol succinate (TOPROL-XL) 25 MG 24 hr tablet Take 1 tablet (25 mg total) by mouth as needed. NEED OV   Multiple Vitamins-Minerals (MULTIVITAMIN WITH MINERALS) tablet Take 1 tablet by mouth daily.    olmesartan-hydrochlorothiazide (BENICAR HCT) 20-12.5 MG tablet Take 1 tablet by mouth daily.   sertraline (ZOLOFT) 100 MG tablet Take 1 tablet by mouth daily.   sulfaSALAzine (AZULFIDINE) 500 MG EC tablet Take 4 tablets by mouth daily.   UNABLE TO FIND Med Name: ocuivite po take one tablet a day   [DISCONTINUED] metoprolol succinate (TOPROL-XL) 25 MG 24 hr tablet Take 1 tablet (25 mg total) by mouth daily. NEED OV    No Known Allergies   SOCIAL HISTORY/FAMILY HISTORY   Social History   Tobacco Use   Smoking status: Never Smoker   Smokeless tobacco: Never Used  Substance Use Topics   Alcohol use: Yes    Alcohol/week: 2.0 standard drinks    Types: 2 Glasses of wine per week    Comment: Rare, occasional   Drug use: No   Social History   Social History Narrative   Married [name]) mother of 2, 3 grandchildren and 2 great-grandchildren.    family history includes Arthritis in her brother; Heart failure in her mother and sister; Obesity in her brother; Prostate cancer in her father.   OBJCTIVE -PE, EKG, labs   Wt Readings from Last 3 Encounters:  02/08/19 167 lb (75.8 kg)  06/12/17 161 lb 12.8 oz (73.4 kg)  05/12/17 163 lb 3.2 oz (74 kg)    Physical Exam: BP (!) 102/57    Pulse 73    Ht 5\' 4"  (1.626 m)    Wt 167 lb (75.8 kg)    SpO2 98%    BMI 28.67 kg/m  Physical Exam  Constitutional: She is oriented to person, place, and time. She appears well-developed and well-nourished. No distress.  Healthy-appearing.  Well-groomed  HENT:  Head: Normocephalic and atraumatic.  Neck: Normal range of motion. Neck supple. No hepatojugular reflux and no JVD present. Carotid bruit is not present.  Cardiovascular: Normal rate, regular rhythm and normal pulses.  No extrasystoles are present. PMI is not displaced. Exam reveals no gallop and no friction rub.  No murmur heard. Pulmonary/Chest: Effort normal and breath sounds normal. No respiratory distress. She has no wheezes. She has  no rales.  Abdominal: Soft. Bowel sounds are normal. She exhibits no distension. There is no abdominal tenderness. There is no rebound.  Musculoskeletal: Normal range of motion.        General: No edema.  Neurological: She is alert and oriented to person, place, and time.  Psychiatric: She has a normal mood and affect. Her behavior is normal. Judgment and thought content normal.     Adult ECG Report  Rate: 78 ;  Rhythm: normal sinus rhythm and Normal axis, intervals and durations.;   Narrative Interpretation: Normal EKG  Recent Labs: November 09, 2018: TC 143, HDL 47, LDL 68, TG 143.   ASSESSMENT/PLAN    Problem List Items Addressed This Visit    Labile essential hypertension - Primary (Chronic)    Blood pressure has been up and down.  Currently low.  I think we can try to see how she does weaning off of the beta-blocker.  She will wean off gradually reducing to 12.5 mg Toprol daily for a month and  then every other day for 2 weeks then stop.  Continue ARB-HCTZ.      Relevant Medications   metoprolol succinate (TOPROL-XL) 25 MG 24 hr tablet   Other Relevant Orders   EKG 12-Lead (Completed)   Hyperlipidemia with target LDL less than 100 (Chronic)   Relevant Medications   metoprolol succinate (TOPROL-XL) 25 MG 24 hr tablet   Palpitations (Chronic)    Pretty well controlled and not a major issue for her.  She would like to see if she can come off the beta-blocker.  Plan to wean off and see how she does.  If palpitations recur, would probably restart.      DOE (dyspnea on exertion)    Not really much of an issue now.  She is gradually getting into better shape.      Relevant Orders   EKG 12-Lead (Completed)    Other Visit Diagnoses    Need for immunization against influenza       Relevant Orders   Flu Vaccine QUAD High Dose(Fluad) (Completed)      COVID-19 Education: The signs and symptoms of COVID-19 were discussed with the patient and how to seek care for testing  (follow up with PCP or arrange E-visit).   The importance of social distancing was discussed today.  I spent a total of 16 minutes with the patient and chart review. >  50% of the time was spent in direct patient consultation.  Additional time spent with chart review (studies, outside notes, etc): 4 Total Time: 20 min  Current medicines are reviewed at length with the patient today.  (+/- concerns) n/a   Patient Instructions / Medication Changes & Studies & Tests Ordered   Patient Instructions  Medication Instructions:   WEAN OFF- METOPROLOL TAKE 1/2 TABLET A DAY FOR ONE MONTH  ( IF  NO SPIKES IN BLOOD PRESSURE OR HEART RATE - CONTINUE WITH TAKING 1/2 TABLET EVERY OTHER DAY  FOR 2 WEEKS THEN STOP TAKING METOPROLOL.  MAY USE  AS NEEDED - INCREASE BLOOD PRESSURE AND /OR HEART RATE. *If you need a refill on your cardiac medications before your next appointment, please call your pharmacy*  Lab Work: NOT NEEDED  Testing/Procedures: NOT NEEDED  Follow-Up: At [facility], you and your health needs are our priority.  As part of our continuing mission to provide you with exceptional heart care, we have created designated Provider Care Teams.  These Care Teams include your primary Cardiologist (physician) and Advanced Practice Providers (APPs -  Physician Assistants and Nurse Practitioners) who all work together to provide you with the care you need, when you need it.  Your next appointment:   12 months  The format for your next appointment:   In Person  Provider:   Glenetta Hew, MD  Other Instructions     Studies Ordered:   Orders Placed This Encounter  Procedures   Flu Vaccine QUAD High Dose(Fluad)   EKG 12-Lead     Glenetta Hew, M.D., M.S. Interventional Cardiologist   Pager # [phone] Phone # [phone] [address] [place], [place] [address]   Thank you for choosing Heartcare at [facility]!!

## 2019-02-08 NOTE — Patient Instructions (Signed)
Medication Instructions:   WEAN OFF- METOPROLOL TAKE 1/2 TABLET A DAY FOR ONE MONTH  ( IF  NO SPIKES IN BLOOD PRESSURE OR HEART RATE - CONTINUE WITH TAKING 1/2 TABLET EVERY OTHER DAY  FOR 2 WEEKS THEN STOP TAKING METOPROLOL.  MAY USE  AS NEEDED - INCREASE BLOOD PRESSURE AND /OR HEART RATE. *If you need a refill on your cardiac medications before your next appointment, please call your pharmacy*  Lab Work: NOT NEEDED  Testing/Procedures: NOT NEEDED  Follow-Up: At [facility], you and your health needs are our priority.  As part of our continuing mission to provide you with exceptional heart care, we have created designated Provider Care Teams.  These Care Teams include your primary Cardiologist (physician) and Advanced Practice Providers (APPs -  Physician Assistants and Nurse Practitioners) who all work together to provide you with the care you need, when you need it.  Your next appointment:   12 months  The format for your next appointment:   In Person  Provider:   Glenetta Hew, MD  Other Instructions

## 2019-02-09 ENCOUNTER — Encounter: Payer: Self-pay | Admitting: Cardiology

## 2019-02-10 DIAGNOSIS — R002 Palpitations: Secondary | ICD-10-CM | POA: Insufficient documentation

## 2019-02-10 NOTE — Assessment & Plan Note (Signed)
Not really much of an issue now.  She is gradually getting into better shape.

## 2019-02-10 NOTE — Assessment & Plan Note (Signed)
Pretty well controlled and not a major issue for her.  She would like to see if she can come off the beta-blocker.  Plan to wean off and see how she does.  If palpitations recur, would probably restart.

## 2019-02-10 NOTE — Assessment & Plan Note (Signed)
Blood pressure has been up and down.  Currently low.  I think we can try to see how she does weaning off of the beta-blocker.  She will wean off gradually reducing to 12.5 mg Toprol daily for a month and then every other day for 2 weeks then stop.  Continue ARB-HCTZ.

## 2019-02-23 DIAGNOSIS — M25561 Pain in right knee: Secondary | ICD-10-CM | POA: Diagnosis not present

## 2019-02-23 DIAGNOSIS — M0589 Other rheumatoid arthritis with rheumatoid factor of multiple sites: Secondary | ICD-10-CM | POA: Diagnosis not present

## 2019-02-23 DIAGNOSIS — Z79899 Other long term (current) drug therapy: Secondary | ICD-10-CM | POA: Diagnosis not present

## 2019-02-23 DIAGNOSIS — E663 Overweight: Secondary | ICD-10-CM | POA: Diagnosis not present

## 2019-02-23 DIAGNOSIS — M353 Polymyalgia rheumatica: Secondary | ICD-10-CM | POA: Diagnosis not present

## 2019-02-23 DIAGNOSIS — Z6828 Body mass index (BMI) 28.0-28.9, adult: Secondary | ICD-10-CM | POA: Diagnosis not present

## 2019-02-23 DIAGNOSIS — M150 Primary generalized (osteo)arthritis: Secondary | ICD-10-CM | POA: Diagnosis not present

## 2019-05-03 ENCOUNTER — Ambulatory Visit: Payer: Medicare Other | Attending: Internal Medicine

## 2019-05-03 DIAGNOSIS — Z23 Encounter for immunization: Secondary | ICD-10-CM | POA: Insufficient documentation

## 2019-05-03 NOTE — Progress Notes (Signed)
   Covid-19 Vaccination Clinic  Name:  Zamarah Quist    MRN: [id number] DOB: [birth year]  05/03/2019  Ms. Kendricks was observed post Covid-19 immunization for 15 minutes without incidence. She was provided with Vaccine Information Sheet and instruction to access the V-Safe system.   Ms. Hiller was instructed to call 911 with any severe reactions post vaccine: Marland Kitchen Difficulty breathing  . Swelling of your face and throat  . A fast heartbeat  . A bad rash all over your body  . Dizziness and weakness    Immunizations Administered    Name Date Dose VIS Date Route   Pfizer COVID-19 Vaccine 05/03/2019  9:08 AM 0.3 mL 03/19/2019 Intramuscular   Manufacturer: [place]   Lot: [id number]   [place]: [id number]

## 2019-05-24 ENCOUNTER — Ambulatory Visit: Payer: Medicare Other | Attending: Internal Medicine

## 2019-05-24 DIAGNOSIS — Z23 Encounter for immunization: Secondary | ICD-10-CM | POA: Insufficient documentation

## 2019-05-24 NOTE — Progress Notes (Signed)
   Covid-19 Vaccination Clinic  Name:  April Terry    MRN: [id number] DOB: 11-03-1940  05/24/2019  [name] was observed post Covid-19 immunization for 15 minutes without incidence. She was provided with Vaccine Information Sheet and instruction to access the V-Safe system.   [name] was instructed to call 911 with any severe reactions post vaccine: Marland Kitchen Difficulty breathing  . Swelling of your face and throat  . A fast heartbeat  . A bad rash all over your body  . Dizziness and weakness    Immunizations Administered    Name Date Dose VIS Date Route   Pfizer COVID-19 Vaccine 05/24/2019  9:47 AM 0.3 mL 03/19/2019 Intramuscular   Manufacturer: [place]   Lot: [id number]   [place]: [id number]

## 2019-11-15 ENCOUNTER — Other Ambulatory Visit: Payer: Self-pay | Admitting: Internal Medicine

## 2019-11-15 DIAGNOSIS — Z1231 Encounter for screening mammogram for malignant neoplasm of breast: Secondary | ICD-10-CM

## 2019-11-29 ENCOUNTER — Other Ambulatory Visit: Payer: Self-pay | Admitting: Internal Medicine

## 2019-11-29 DIAGNOSIS — M8580 Other specified disorders of bone density and structure, unspecified site: Secondary | ICD-10-CM

## 2019-11-30 ENCOUNTER — Ambulatory Visit
Admission: RE | Admit: 2019-11-30 | Discharge: 2019-11-30 | Disposition: A | Discharge status: Home / Self Care | Payer: Medicare Other | Source: Ambulatory Visit | Attending: Internal Medicine | Admitting: Internal Medicine

## 2019-11-30 ENCOUNTER — Other Ambulatory Visit: Payer: Self-pay

## 2019-11-30 DIAGNOSIS — Z1231 Encounter for screening mammogram for malignant neoplasm of breast: Secondary | ICD-10-CM

## 2019-11-30 DIAGNOSIS — M8580 Other specified disorders of bone density and structure, unspecified site: Secondary | ICD-10-CM

## 2020-01-06 ENCOUNTER — Telehealth: Payer: Self-pay | Admitting: Cardiology

## 2020-01-06 NOTE — Telephone Encounter (Signed)
lvm for patient to return call to get follow up scheduled with Ellyn Hack from recall list

## 2020-01-20 ENCOUNTER — Other Ambulatory Visit: Payer: Self-pay

## 2020-01-20 ENCOUNTER — Ambulatory Visit (INDEPENDENT_AMBULATORY_CARE_PROVIDER_SITE_OTHER): Payer: Medicare Other | Admitting: Cardiology

## 2020-01-20 ENCOUNTER — Encounter: Payer: Self-pay | Admitting: Cardiology

## 2020-01-20 VITALS — BP 128/78 | HR 82 | Ht 64.0 in | Wt 166.8 lb

## 2020-01-20 DIAGNOSIS — R0609 Other forms of dyspnea: Secondary | ICD-10-CM

## 2020-01-20 DIAGNOSIS — R002 Palpitations: Secondary | ICD-10-CM | POA: Diagnosis not present

## 2020-01-20 DIAGNOSIS — R0600 Dyspnea, unspecified: Secondary | ICD-10-CM | POA: Diagnosis not present

## 2020-01-20 DIAGNOSIS — I10 Essential (primary) hypertension: Secondary | ICD-10-CM

## 2020-01-20 DIAGNOSIS — E785 Hyperlipidemia, unspecified: Secondary | ICD-10-CM | POA: Diagnosis not present

## 2020-01-20 NOTE — Progress Notes (Signed)
Primary Care Provider: Leeroy Cha, MD Cardiologist: No primary care provider on file. Electrophysiologist: None  Clinic Note: Chief Complaint  Patient presents with  . Follow-up    No complaints  . Palpitations    None the last 18 months.  No change to having stopped beta-blocker.  . Hypertension    Well-controlled    HPI:    April Terry is a 79 y.o. female with a PMH below who presents today for annual follow-up for PALPITATIONS and LABILE HYPERTENSION.  She was seen on February 08, 2019 -doing well from cardiac standpoint.  No palpitations or irregular heartbeats.  Blood pressures pretty well controlled.  Wanted to see if she could maybe stop the beta-blocker.  She noted movement fatigue.--Now off the beta-blocker and use it for PRN hypertension or palpitations.  Recent Hospitalizations: None   Reviewed  CV studies:    The following studies were reviewed today: (if available, images/films reviewed: From Epic Chart or Care Everywhere) . None:   Interval History:   April Terry returns today for annual follow-up again doing very well.  Since stopping the beta-blocker she still is not noticing any palpitations or irregular heartbeats.  She remains active with no major complaints.  Blood pressures have been doing very well.  She has not had to take Metoprolol for that either.  She remains very active walking and doing housework etc.  Overall she has been asymptomatic for over 18 months now.  She was started on sulfasalazine for mood arthritis pains.  Her arthritis is notably improving and she is able to therefore walk more.  CV Review of Symptoms (Summary) Cardiovascular ROS: no chest pain or dyspnea on exertion negative for - edema, irregular heartbeat, orthopnea, palpitations, paroxysmal nocturnal dyspnea, rapid heart rate, shortness of breath or Syncope/near syncope or TIA/amaurosis fugax, claudication  The patient does not have symptoms concerning for  COVID-19 infection (fever, chills, cough, or new shortness of breath).   REVIEWED OF SYSTEMS   Review of Systems  Constitutional: Negative for malaise/fatigue and weight loss.  HENT: Negative for congestion and nosebleeds.   Respiratory: Negative for cough and shortness of breath.   Gastrointestinal: Negative for blood in stool and melena.  Genitourinary: Negative for hematuria.  Musculoskeletal: Positive for joint pain (Mild arthritis).  Neurological: Negative for dizziness and headaches.  Psychiatric/Behavioral: Negative for depression and memory loss. The patient is not nervous/anxious and does not have insomnia.    I have reviewed and (if needed) personally updated the patient's problem list, medications, allergies, past medical and surgical history, social and family history.   PAST MEDICAL HISTORY   Past Medical History:  Diagnosis Date  . Essential hypertension   . H/O polymyalgia rheumatica    Was symptoms have mostly resolved after prolonged treatment with steroids  . Hyperlipidemia    Well-controlled on fish oil and low-dose Lipitor.  . Rheumatoid arthritis involving both hands ([place])   . Situational mixed anxiety and depressive disorder     PAST SURGICAL HISTORY   Past Surgical History:  Procedure Laterality Date  . ABDOMINAL HYSTERECTOMY  1984  . APPENDECTOMY  1950  . TRANSTHORACIC ECHOCARDIOGRAM  02/2012   Mild concentric hypertrophy.  EF 55-60%.  No regional wall motion normality.  No mitral prolapse noted.    Immunization History  Administered Date(s) Administered  . Fluad Quad(high Dose 65+) 02/08/2019  . PFIZER SARS-COV-2 Vaccination 05/03/2019, 05/24/2019    MEDICATIONS/ALLERGIES   Current Meds  Medication Sig  . atorvastatin (LIPITOR) 10 MG  tablet Take 10 mg by mouth daily.  . Calcium Carb-Cholecalciferol (CALCIUM 1000 + D) 1000-800 MG-UNIT TABS Take 1 tablet by mouth daily.  . Cholecalciferol (D3 HIGH POTENCY) 1000 units capsule Take 1,000 Units  by mouth daily.  . irbesartan-hydrochlorothiazide (AVALIDE) 150-12.5 MG tablet TK 1 T PO QD IN THE EVE  . Magnesium 500 MG TABS Take 1 tablet by mouth daily.  . meloxicam (MOBIC) 15 MG tablet TK 1 T PO ONCE A DAY WF  . Multiple Vitamins-Minerals (MULTIVITAMIN WITH MINERALS) tablet Take 1 tablet by mouth daily.  . sertraline (ZOLOFT) 100 MG tablet Take 1 tablet by mouth daily. Pt now taking half tablet for 50 mg  . sulfaSALAzine (AZULFIDINE) 500 MG EC tablet Take 4 tablets by mouth daily.  Marland Kitchen UNABLE TO FIND Med Name: ocuivite po take one tablet a day    No Known Allergies  SOCIAL HISTORY/FAMILY HISTORY   Reviewed in Epic:  Pertinent findings: She has her booster shot scheduled for March 22, 2020  OBJCTIVE -PE, EKG, labs   Wt Readings from Last 3 Encounters:  01/20/20 166 lb 12.8 oz (75.7 kg)  02/08/19 167 lb (75.8 kg)  06/12/17 161 lb 12.8 oz (73.4 kg)    Physical Exam: BP 128/78   Pulse 82   Ht 5\' 4"  (1.626 m)   Wt 166 lb 12.8 oz (75.7 kg)   BMI 28.63 kg/m  Physical Exam Vitals reviewed.  Constitutional:      General: She is not in acute distress.    Appearance: Normal appearance. She is normal weight. She is not ill-appearing (Healthy-appearing.  Well-groomed.) or toxic-appearing.  HENT:     Head: Normocephalic and atraumatic.  Neck:     Vascular: No carotid bruit, hepatojugular reflux or JVD.  Cardiovascular:     Rate and Rhythm: Normal rate and regular rhythm.  No extrasystoles are present.    Chest Wall: PMI is not displaced.     Pulses: Normal pulses and intact distal pulses.     Heart sounds: Normal heart sounds. No murmur heard.  No friction rub. No gallop.   Pulmonary:     Effort: Pulmonary effort is normal. No respiratory distress.     Breath sounds: Normal breath sounds.  Chest:     Chest wall: No tenderness.  Musculoskeletal:        General: No swelling. Normal range of motion.     Cervical back: Normal range of motion and neck supple.   Neurological:     General: No focal deficit present.     Mental Status: She is alert and oriented to person, place, and time.  Psychiatric:        Mood and Affect: Mood normal.        Behavior: Behavior normal.        Thought Content: Thought content normal.        Judgment: Judgment normal.     Adult ECG Report  Rate: 82 ;  Rhythm: normal sinus rhythm and Left atrial enlargement, otherwise normal axis, normal durations.  Normal EKG.;   Narrative Interpretation: Normal/stable  Recent Labs: 11/16/2019: TC 147, TG 133, HDL 51, LDL 70, Hgb 14.4, Cr 0.5, K+ 4.6. No results found for: CHOL, HDL, LDLCALC, LDLDIRECT, TRIG, CHOLHDL No results found for: CREATININE, BUN, NA, K, CL, CO2 No results found for: TSH  ASSESSMENT/PLAN    Problem List Items Addressed This Visit    Labile essential hypertension (Chronic)    Blood pressure continues to be well controlled.  She is now simply on irbesartan-HCTZ.  Not requiring any beta-blocker as needed.      Hyperlipidemia with target LDL less than 100 (Chronic)    Labs look great on 10 mg atorvastatin.  Tolerating well without any major issues.      Palpitations (Chronic)    No longer an issue now.  She is no longer on beta-blocker and has not had to use it in over a year.  Continue to monitor.      Relevant Orders   EKG 12-Lead (Completed)   DOE (dyspnea on exertion) - Primary    Notably improved.  She is back in shape now on exercising.  No major issues.      Relevant Orders   EKG 12-Lead (Completed)       COVID-19 Education: The signs and symptoms of COVID-19 were discussed with the patient and how to seek care for testing (follow up with PCP or arrange E-visit).   The importance of social distancing and COVID-19 vaccination was discussed today. 1 min The patient is practicing social distancing & Masking.   I spent a total of 25 minutes with the patient spent in direct patient consultation.  Additional time spent with chart  review  / charting (studies, outside notes, etc): 6 Total Time: 85min   Current medicines are reviewed at length with the patient today.  (+/- concerns) n/a  This visit occurred during the SARS-CoV-2 public health emergency.  Safety protocols were in place, including screening questions prior to the visit, additional usage of staff PPE, and extensive cleaning of exam room while observing appropriate contact time as indicated for disinfecting solutions.  Notice: This dictation was prepared with Dragon dictation along with smaller phrase technology. Any transcriptional errors that result from this process are unintentional and may not be corrected upon review.  Patient Instructions / Medication Changes & Studies & Tests Ordered   Patient Instructions  Medication Instructions:  No changes *If you need a refill on your cardiac medications before your next appointment, please call your pharmacy*   Lab Work: Not needed   Testing/Procedures: Not needed   Follow-Up: At [facility], you and your health needs are our priority.  As part of our continuing mission to provide you with exceptional heart care, we have created designated Provider Care Teams.  These Care Teams include your primary Cardiologist (physician) and Advanced Practice Providers (APPs -  Physician Assistants and Nurse Practitioners) who all work together to provide you with the care you need, when you need it.    Your next appointment:   12 month(s)  The format for your next appointment:   In Person  Provider:   Glenetta Hew, MD       Studies Ordered:   Orders Placed This Encounter  Procedures  . EKG 12-Lead     Glenetta Hew, M.D., M.S. Interventional Cardiologist   Pager # [phone] Phone # [phone] [address] [place], [place] [address]   Thank you for choosing Heartcare at [facility]!!

## 2020-01-20 NOTE — Patient Instructions (Signed)

## 2020-01-28 ENCOUNTER — Encounter: Payer: Self-pay | Admitting: Cardiology

## 2020-01-28 NOTE — Assessment & Plan Note (Signed)
No longer an issue now.  She is no longer on beta-blocker and has not had to use it in over a year.  Continue to monitor.

## 2020-01-28 NOTE — Assessment & Plan Note (Addendum)
Blood pressure continues to be well controlled.  She is now simply on irbesartan-HCTZ.  Not requiring any beta-blocker as needed.

## 2020-01-28 NOTE — Assessment & Plan Note (Signed)
Labs look great on 10 mg atorvastatin.  Tolerating well without any major issues.

## 2020-01-28 NOTE — Assessment & Plan Note (Signed)
Notably improved.  She is back in shape now on exercising.  No major issues.

## 2020-03-09 ENCOUNTER — Ambulatory Visit: Payer: Medicare Other | Attending: Internal Medicine

## 2020-03-09 DIAGNOSIS — Z23 Encounter for immunization: Secondary | ICD-10-CM

## 2020-03-09 NOTE — Progress Notes (Signed)
   Covid-19 Vaccination Clinic  Name:  Tailynn Armetta    MRN: [id number] DOB: Apr 30, 1940  03/09/2020  Ms. Tinnon was observed post Covid-19 immunization for 15 minutes without incident. She was provided with Vaccine Information Sheet and instruction to access the V-Safe system.   Ms. Marut was instructed to call 911 with any severe reactions post vaccine: Marland Kitchen Difficulty breathing  . Swelling of face and throat  . A fast heartbeat  . A bad rash all over body  . Dizziness and weakness   Immunizations Administered    Name Date Dose VIS Date Route   Pfizer COVID-19 Vaccine 03/09/2020  1:27 PM 0.3 mL 01/26/2020 Intramuscular   Manufacturer: [place]   Lot: [id number]   [place]: [address]-8

## 2020-03-22 ENCOUNTER — Ambulatory Visit: Payer: Medicare Other

## 2020-03-24 IMAGING — MG DIGITAL SCREENING BILATERAL MAMMOGRAM WITH TOMO AND CAD
8 series · 8 of 24 positions shown · non-contrast
Comparison: Previous exam(s).

CLINICAL DATA: Screening.

EXAM:
DIGITAL SCREENING BILATERAL MAMMOGRAM WITH TOMO AND CAD

[L CC synth-2D]
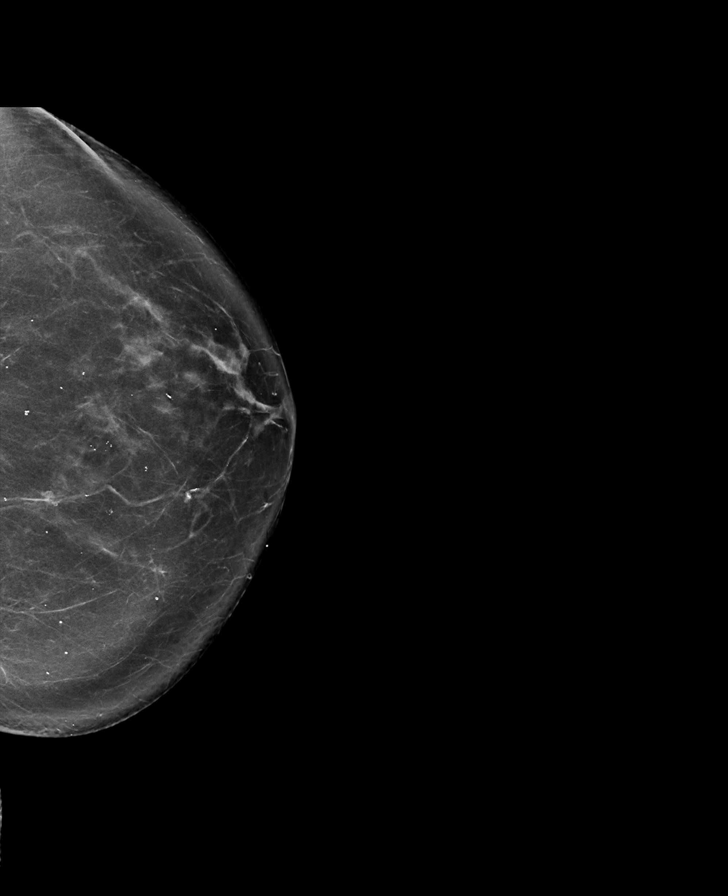

[R MLO synth-2D]
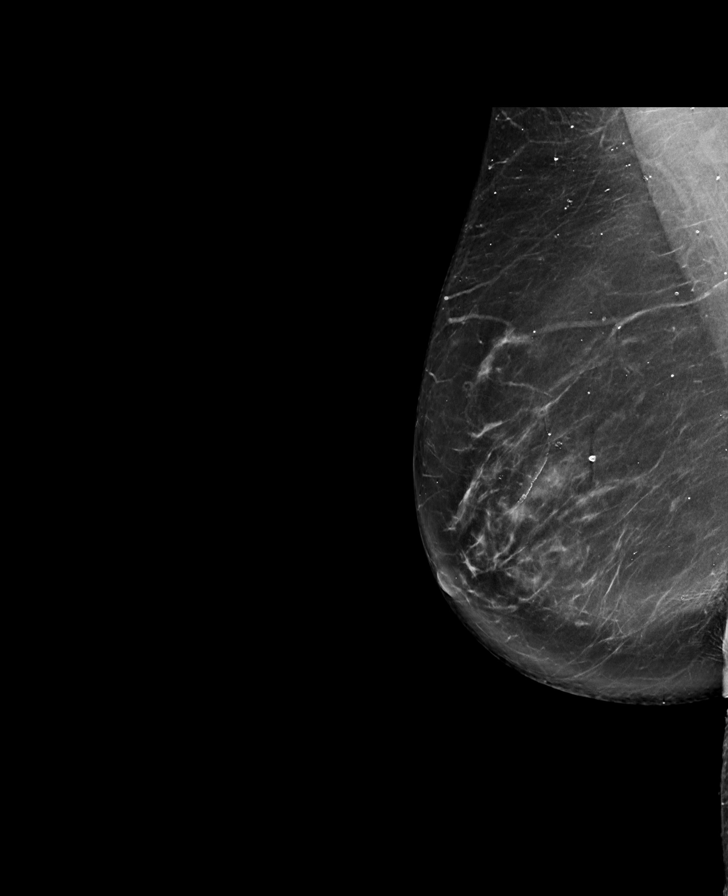

[L MLO synth-2D]
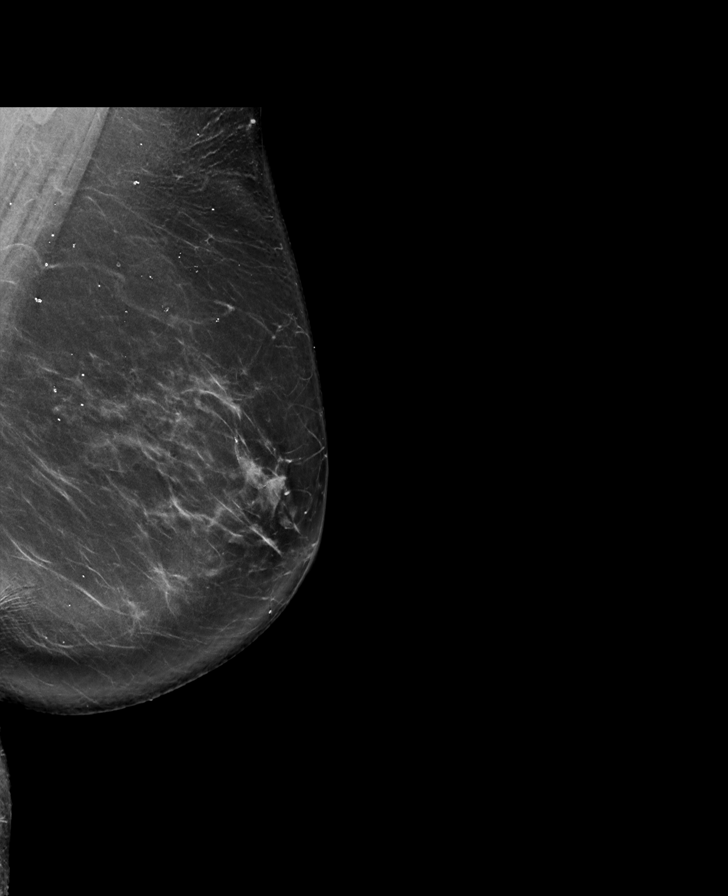

[R CC synth-2D]
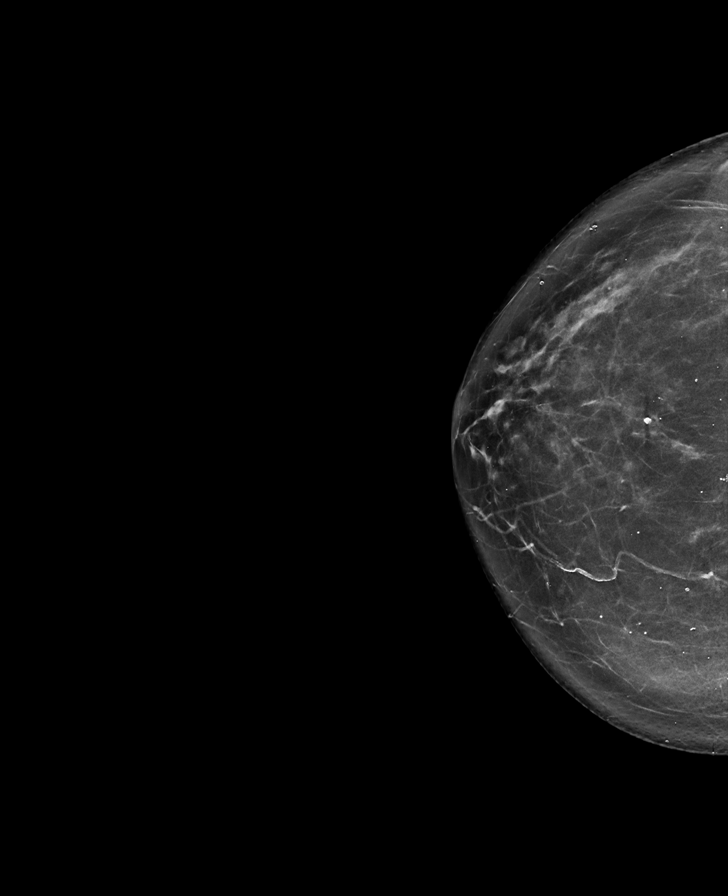

[L CC tomo · tomo slice 41/80.0]
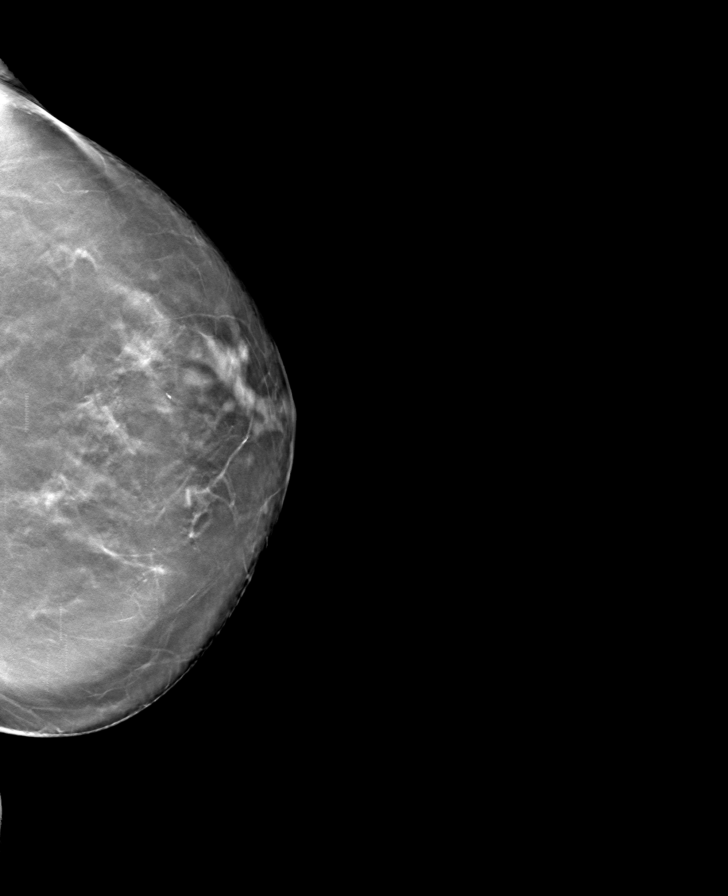

[R MLO tomo · tomo slice 44/87.0]
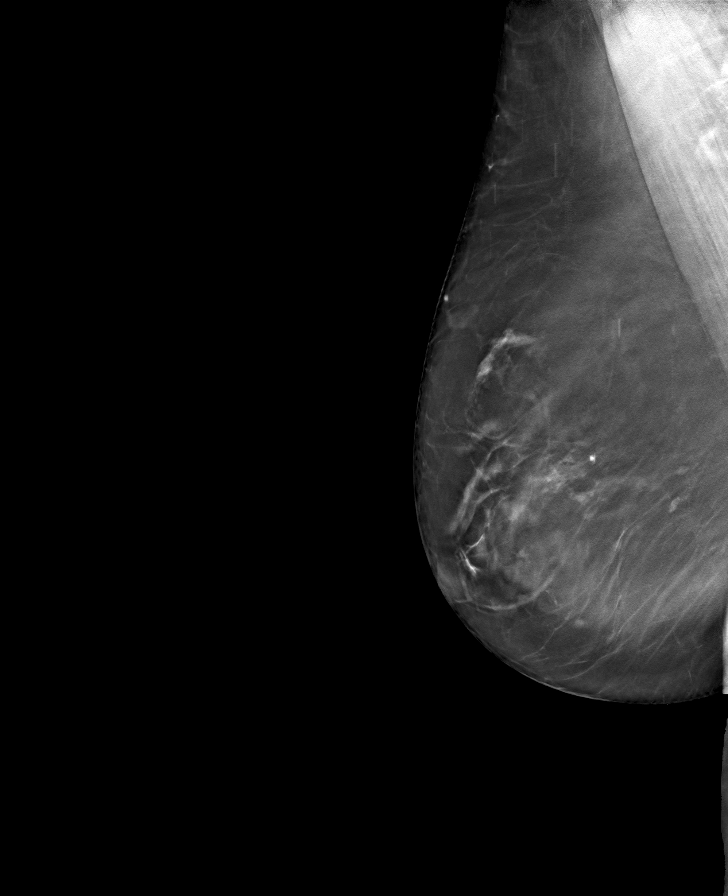

[R CC tomo · tomo slice 37/72.0]
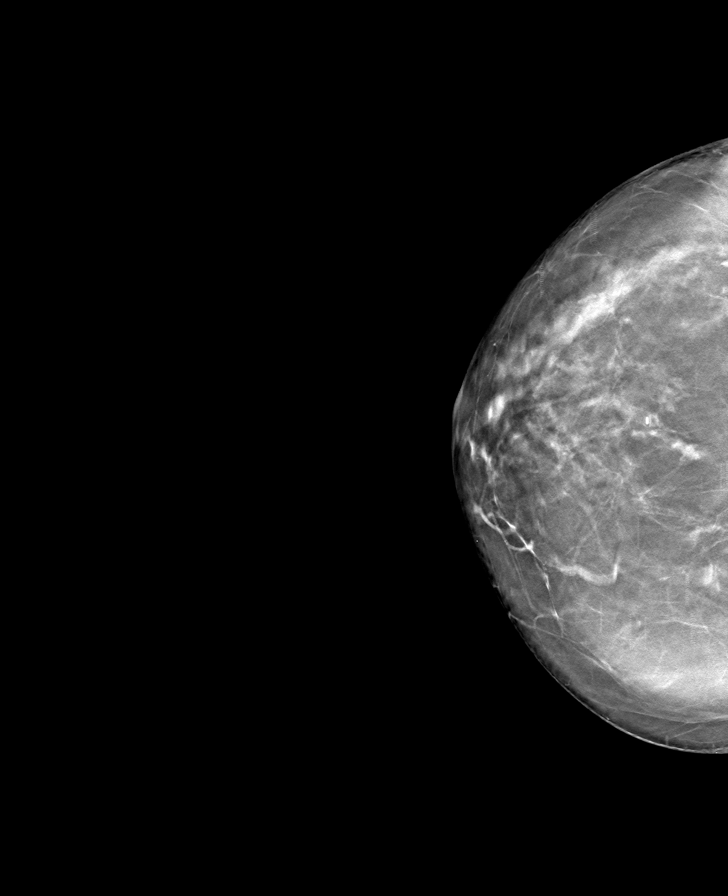

[L MLO tomo · tomo slice 45/88.0]
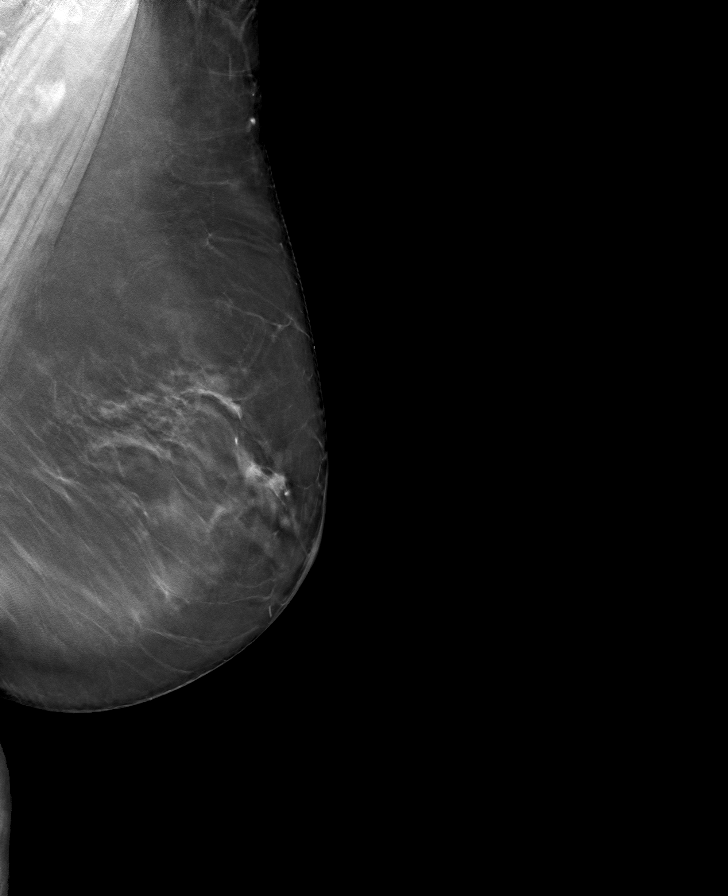

[8 of 24 positions shown; findings below may reference images not displayed]

ACR Breast Density Category b: There are scattered areas of
fibroglandular density.
FINDINGS: There are no findings suspicious for malignancy. Images were
processed with CAD.
IMPRESSION: No mammographic evidence of malignancy. A result letter of this
screening mammogram will be mailed directly to the patient.

RECOMMENDATION:
Screening mammogram in one year. (Code:[id number])

BI-RADS CATEGORY  1: Negative.

## 2020-04-14 DIAGNOSIS — F3341 Major depressive disorder, recurrent, in partial remission: Secondary | ICD-10-CM | POA: Diagnosis not present

## 2020-04-14 DIAGNOSIS — M069 Rheumatoid arthritis, unspecified: Secondary | ICD-10-CM | POA: Diagnosis not present

## 2020-04-14 DIAGNOSIS — E7800 Pure hypercholesterolemia, unspecified: Secondary | ICD-10-CM | POA: Diagnosis not present

## 2020-04-14 DIAGNOSIS — I10 Essential (primary) hypertension: Secondary | ICD-10-CM | POA: Diagnosis not present

## 2020-05-23 DIAGNOSIS — I10 Essential (primary) hypertension: Secondary | ICD-10-CM | POA: Diagnosis not present

## 2020-05-23 DIAGNOSIS — F3341 Major depressive disorder, recurrent, in partial remission: Secondary | ICD-10-CM | POA: Diagnosis not present

## 2020-05-23 DIAGNOSIS — E7800 Pure hypercholesterolemia, unspecified: Secondary | ICD-10-CM | POA: Diagnosis not present

## 2020-05-23 DIAGNOSIS — M069 Rheumatoid arthritis, unspecified: Secondary | ICD-10-CM | POA: Diagnosis not present

## 2020-06-07 DIAGNOSIS — E663 Overweight: Secondary | ICD-10-CM | POA: Diagnosis not present

## 2020-06-07 DIAGNOSIS — Z6829 Body mass index (BMI) 29.0-29.9, adult: Secondary | ICD-10-CM | POA: Diagnosis not present

## 2020-06-07 DIAGNOSIS — M25561 Pain in right knee: Secondary | ICD-10-CM | POA: Diagnosis not present

## 2020-06-07 DIAGNOSIS — Z79899 Other long term (current) drug therapy: Secondary | ICD-10-CM | POA: Diagnosis not present

## 2020-06-07 DIAGNOSIS — M0589 Other rheumatoid arthritis with rheumatoid factor of multiple sites: Secondary | ICD-10-CM | POA: Diagnosis not present

## 2020-06-07 DIAGNOSIS — M150 Primary generalized (osteo)arthritis: Secondary | ICD-10-CM | POA: Diagnosis not present

## 2020-06-07 DIAGNOSIS — M353 Polymyalgia rheumatica: Secondary | ICD-10-CM | POA: Diagnosis not present

## 2020-07-11 DIAGNOSIS — Z79899 Other long term (current) drug therapy: Secondary | ICD-10-CM | POA: Diagnosis not present

## 2020-07-11 DIAGNOSIS — E663 Overweight: Secondary | ICD-10-CM | POA: Diagnosis not present

## 2020-07-11 DIAGNOSIS — M0589 Other rheumatoid arthritis with rheumatoid factor of multiple sites: Secondary | ICD-10-CM | POA: Diagnosis not present

## 2020-07-11 DIAGNOSIS — M150 Primary generalized (osteo)arthritis: Secondary | ICD-10-CM | POA: Diagnosis not present

## 2020-07-11 DIAGNOSIS — M25561 Pain in right knee: Secondary | ICD-10-CM | POA: Diagnosis not present

## 2020-07-11 DIAGNOSIS — Z6829 Body mass index (BMI) 29.0-29.9, adult: Secondary | ICD-10-CM | POA: Diagnosis not present

## 2020-07-11 DIAGNOSIS — M353 Polymyalgia rheumatica: Secondary | ICD-10-CM | POA: Diagnosis not present

## 2020-08-17 ENCOUNTER — Other Ambulatory Visit: Payer: Self-pay

## 2020-08-17 ENCOUNTER — Other Ambulatory Visit (HOSPITAL_BASED_OUTPATIENT_CLINIC_OR_DEPARTMENT_OTHER): Payer: Self-pay

## 2020-08-17 ENCOUNTER — Ambulatory Visit: Payer: Medicare Other | Attending: Internal Medicine

## 2020-08-17 DIAGNOSIS — Z23 Encounter for immunization: Secondary | ICD-10-CM

## 2020-08-17 MED ORDER — PFIZER-BIONT COVID-19 VAC-TRIS 30 MCG/0.3ML IM SUSP
INTRAMUSCULAR | 0 refills | Status: DC
  Filled 2020-08-17: qty 0.3, 1d supply, fill #0

## 2020-08-17 NOTE — Progress Notes (Signed)
   Covid-19 Vaccination Clinic  Name:  April Terry    MRN: [id number] DOB: [birth year]  08/17/2020  [name] was observed post Covid-19 immunization for 15 minutes without incident. She was provided with Vaccine Information Sheet and instruction to access the V-Safe system.   [name] was instructed to call 911 with any severe reactions post vaccine: Marland Kitchen Difficulty breathing  . Swelling of face and throat  . A fast heartbeat  . A bad rash all over body  . Dizziness and weakness   Immunizations Administered    Name Date Dose VIS Date Route   PFIZER Comrnaty(Gray TOP) Covid-19 Vaccine 08/17/2020 10:06 AM 0.3 mL 03/16/2020 Intramuscular   Manufacturer: [employer]   Lot: [id number]   NDC: [id number]

## 2020-10-10 DIAGNOSIS — Z6828 Body mass index (BMI) 28.0-28.9, adult: Secondary | ICD-10-CM | POA: Diagnosis not present

## 2020-10-10 DIAGNOSIS — M25561 Pain in right knee: Secondary | ICD-10-CM | POA: Diagnosis not present

## 2020-10-10 DIAGNOSIS — E663 Overweight: Secondary | ICD-10-CM | POA: Diagnosis not present

## 2020-10-10 DIAGNOSIS — M150 Primary generalized (osteo)arthritis: Secondary | ICD-10-CM | POA: Diagnosis not present

## 2020-10-10 DIAGNOSIS — Z79899 Other long term (current) drug therapy: Secondary | ICD-10-CM | POA: Diagnosis not present

## 2020-10-10 DIAGNOSIS — M0589 Other rheumatoid arthritis with rheumatoid factor of multiple sites: Secondary | ICD-10-CM | POA: Diagnosis not present

## 2020-10-10 DIAGNOSIS — M353 Polymyalgia rheumatica: Secondary | ICD-10-CM | POA: Diagnosis not present

## 2020-11-17 DIAGNOSIS — Z90710 Acquired absence of both cervix and uterus: Secondary | ICD-10-CM | POA: Diagnosis not present

## 2020-11-17 DIAGNOSIS — M353 Polymyalgia rheumatica: Secondary | ICD-10-CM | POA: Diagnosis not present

## 2020-11-17 DIAGNOSIS — E739 Lactose intolerance, unspecified: Secondary | ICD-10-CM | POA: Diagnosis not present

## 2020-11-17 DIAGNOSIS — R7309 Other abnormal glucose: Secondary | ICD-10-CM | POA: Diagnosis not present

## 2020-11-17 DIAGNOSIS — Z0000 Encounter for general adult medical examination without abnormal findings: Secondary | ICD-10-CM | POA: Diagnosis not present

## 2020-11-17 DIAGNOSIS — R739 Hyperglycemia, unspecified: Secondary | ICD-10-CM | POA: Diagnosis not present

## 2020-11-17 DIAGNOSIS — E7800 Pure hypercholesterolemia, unspecified: Secondary | ICD-10-CM | POA: Diagnosis not present

## 2020-11-17 DIAGNOSIS — I10 Essential (primary) hypertension: Secondary | ICD-10-CM | POA: Diagnosis not present

## 2020-11-17 DIAGNOSIS — F3341 Major depressive disorder, recurrent, in partial remission: Secondary | ICD-10-CM | POA: Diagnosis not present

## 2020-11-17 DIAGNOSIS — M069 Rheumatoid arthritis, unspecified: Secondary | ICD-10-CM | POA: Diagnosis not present

## 2020-11-17 DIAGNOSIS — M5431 Sciatica, right side: Secondary | ICD-10-CM | POA: Diagnosis not present

## 2020-11-20 DIAGNOSIS — H52203 Unspecified astigmatism, bilateral: Secondary | ICD-10-CM | POA: Diagnosis not present

## 2020-11-20 DIAGNOSIS — H2513 Age-related nuclear cataract, bilateral: Secondary | ICD-10-CM | POA: Diagnosis not present

## 2020-12-04 ENCOUNTER — Other Ambulatory Visit: Payer: Self-pay | Admitting: Internal Medicine

## 2020-12-04 DIAGNOSIS — Z1231 Encounter for screening mammogram for malignant neoplasm of breast: Secondary | ICD-10-CM

## 2020-12-06 ENCOUNTER — Other Ambulatory Visit: Payer: Self-pay

## 2020-12-06 ENCOUNTER — Ambulatory Visit
Admission: RE | Admit: 2020-12-06 | Discharge: 2020-12-06 | Disposition: A | Discharge status: Home / Self Care | Payer: Medicare Other | Source: Ambulatory Visit

## 2020-12-06 DIAGNOSIS — Z1231 Encounter for screening mammogram for malignant neoplasm of breast: Secondary | ICD-10-CM | POA: Diagnosis not present

## 2021-01-24 ENCOUNTER — Other Ambulatory Visit: Payer: Self-pay

## 2021-01-24 ENCOUNTER — Ambulatory Visit (INDEPENDENT_AMBULATORY_CARE_PROVIDER_SITE_OTHER): Payer: Medicare Other | Admitting: Cardiology

## 2021-01-24 VITALS — BP 101/56 | HR 85 | Ht 64.0 in | Wt 166.6 lb

## 2021-01-24 DIAGNOSIS — R0609 Other forms of dyspnea: Secondary | ICD-10-CM | POA: Diagnosis not present

## 2021-01-24 DIAGNOSIS — R002 Palpitations: Secondary | ICD-10-CM

## 2021-01-24 DIAGNOSIS — I10 Essential (primary) hypertension: Secondary | ICD-10-CM | POA: Diagnosis not present

## 2021-01-24 DIAGNOSIS — E785 Hyperlipidemia, unspecified: Secondary | ICD-10-CM | POA: Diagnosis not present

## 2021-01-24 DIAGNOSIS — Z23 Encounter for immunization: Secondary | ICD-10-CM

## 2021-01-24 NOTE — Progress Notes (Signed)
Primary Care Provider: Leeroy Cha, MD Cardiologist: None Electrophysiologist: None  Clinic Note: Chief Complaint  Patient presents with   Follow-up    Annual.  No major issues.    ===================================  ASSESSMENT/PLAN   Problem List Items Addressed This Visit       Cardiology Problems   Labile essential hypertension - Primary (Chronic)    Blood pressure looks pretty good today.  It has been quite labile.  A little on the low side, would not be more aggressive even though she has some intermittent high levels.  He is only on irbesartan and HCTZ.  I indicated that if she feels lightheaded or dizzy, she should only take half a dose.      Relevant Orders   EKG 12-Lead (Completed)   Hyperlipidemia with target LDL less than 100 (Chronic)    She is on low-dose atorvastatin, LDL 75.  Well within goal.        Other   DOE (dyspnea on exertion)    Notably improved.  Was likely related to deconditioning.  Now that she is more active, no longer having issue.      Palpitations (Chronic)    Not having any further episodes.  Not on beta-blocker because of bradycardia concerns. No longer using PRN beta-blocker.      Relevant Orders   EKG 12-Lead (Completed)   Other Visit Diagnoses     Need for immunization against influenza       Relevant Orders   Flu Vaccine QUAD High Dose(Fluad) (Completed)       ===================================  HPI:    April Terry is a 81 y.o. female with a PMH notable for labile hypertension and palpitations who presents today for annual follow-up of palpitations.  April Terry was last seen on 10/14 was doing well.  Still noticing minimal palpitations despite stopping beta-blocker.  Had not had to take any PRN metoprolol.  Asymptomatic for over 18 months.  Recent Hospitalizations: None  Reviewed  CV studies:    The following studies were reviewed today: (if available, images/films reviewed: From Epic Chart or  Care Everywhere) None:  Interval History:   [name] returns today for annual follow-up doing well.  No more episodes of palpitations.  She is not on any metaplasia.  Has not used any PRN beta-blocker.  Blood pressures have also pretty well controlled.  She stays active walking her dog doing yard work, walking around the garden.  She also enjoys shopping. No significant cardiac symptoms:  CV Review of Symptoms (Summary) Cardiovascular ROS: no chest pain or dyspnea on exertion negative for - edema, irregular heartbeat, orthopnea, palpitations, paroxysmal nocturnal dyspnea, rapid heart rate, shortness of breath, or syncope/near syncope or TIA/amaurosis fugax, or claudication.  REVIEWED OF SYSTEMS   Review of Systems  Constitutional:  Negative for malaise/fatigue and weight loss.  Respiratory:  Negative for cough and shortness of breath.   Cardiovascular:  Negative for claudication.  Gastrointestinal:  Negative for blood in stool and melena.  Genitourinary:  Negative for hematuria.  Musculoskeletal:  Positive for joint pain.  Neurological: Negative.  Negative for dizziness, focal weakness and weakness.  Psychiatric/Behavioral: Negative.     I have reviewed and (if needed) personally updated the patient's problem list, medications, allergies, past medical and surgical history, social and family history.   PAST MEDICAL HISTORY   Past Medical History:  Diagnosis Date   Essential hypertension    H/O polymyalgia rheumatica    Was symptoms have mostly resolved after  prolonged treatment with steroids   Hyperlipidemia    Well-controlled on fish oil and low-dose Lipitor.   Rheumatoid arthritis involving both hands ([place])    Situational mixed anxiety and depressive disorder     PAST SURGICAL HISTORY   Past Surgical History:  Procedure Laterality Date   ABDOMINAL HYSTERECTOMY  1984   APPENDECTOMY  1950   TRANSTHORACIC ECHOCARDIOGRAM  02/2012   Mild concentric hypertrophy.  EF  55-60%.  No regional wall motion normality.  No mitral prolapse noted.    Immunization History  Administered Date(s) Administered   Fluad Quad(high Dose 65+) 02/08/2019, 01/24/2021   PFIZER Comirnaty(Gray Top)Covid-19 Tri-Sucrose Vaccine 08/17/2020   PFIZER(Purple Top)SARS-COV-2 Vaccination 05/03/2019, 05/24/2019, 03/09/2020   Pfizer Covid-19 Vaccine Bivalent Booster 14yrs & up 02/09/2021    MEDICATIONS/ALLERGIES   Current Meds  Medication Sig   atorvastatin (LIPITOR) 10 MG tablet Take 10 mg by mouth daily.   Calcium Carb-Cholecalciferol 1000-800 MG-UNIT TABS Take 1 tablet by mouth daily.   chlorhexidine (PERIDEX) 0.12 % solution 15 mLs 2 (two) times daily.   Cholecalciferol 25 MCG (1000 UT) capsule Take 1,000 Units by mouth daily.   irbesartan-hydrochlorothiazide (AVALIDE) 150-12.5 MG tablet TK 1 T PO QD IN THE EVE   Magnesium 500 MG TABS Take 1 tablet by mouth daily.   meloxicam (MOBIC) 15 MG tablet Take 15 mg by mouth as needed.   Multiple Vitamins-Minerals (MULTIVITAMIN WITH MINERALS) tablet Take 1 tablet by mouth daily.   sertraline (ZOLOFT) 100 MG tablet Take 1 tablet by mouth daily. Pt now taking half tablet for 50 mg   sulfaSALAzine (AZULFIDINE) 500 MG EC tablet Take 4 tablets by mouth daily.   UNABLE TO FIND Med Name: ocuivite po take one tablet a day    No Known Allergies  SOCIAL HISTORY/FAMILY HISTORY   Reviewed in Epic:  Pertinent findings:  Social History   Tobacco Use   Smoking status: Never   Smokeless tobacco: Never  Substance Use Topics   Alcohol use: Yes    Alcohol/week: 2.0 standard drinks    Types: 2 Glasses of wine per week    Comment: Rare, occasional   Drug use: No   Social History   Social History Narrative   Married April Terry) mother of 2, 3 grandchildren and 2 great-grandchildren.    OBJCTIVE -PE, EKG, labs   Wt Readings from Last 3 Encounters:  01/24/21 166 lb 9.6 oz (75.6 kg)  01/20/20 166 lb 12.8 oz (75.7 kg)  02/08/19 167 lb (75.8  kg)    Physical Exam: BP (!) 101/56   Pulse 85   Ht 5\' 4"  (1.626 m)   Wt 166 lb 9.6 oz (75.6 kg)   SpO2 97%   BMI 28.60 kg/m  Physical Exam Vitals reviewed.  Constitutional:      General: She is not in acute distress.    Appearance: Normal appearance. She is normal weight. She is not ill-appearing or toxic-appearing.  HENT:     Head: Normocephalic and atraumatic.  Neck:     Vascular: No carotid bruit.  Cardiovascular:     Rate and Rhythm: Normal rate and regular rhythm.     Pulses: Normal pulses.     Heart sounds: Normal heart sounds. No murmur heard.   No friction rub. No gallop.  Pulmonary:     Effort: Pulmonary effort is normal. No respiratory distress.     Breath sounds: Normal breath sounds.  Chest:     Chest wall: No tenderness.  Musculoskeletal:  General: No swelling. Normal range of motion.     Cervical back: Normal range of motion and neck supple.  Skin:    General: Skin is warm and dry.     Coloration: Skin is not jaundiced.  Neurological:     General: No focal deficit present.     Mental Status: She is alert and oriented to person, place, and time.     Gait: Gait normal.  Psychiatric:        Mood and Affect: Mood normal.        Behavior: Behavior normal.        Thought Content: Thought content normal.        Judgment: Judgment normal.     Adult ECG Report  Rate: 85 ;  Rhythm: normal sinus rhythm and left atrial abnormality.  Otherwise normal axis, intervals durations. ;   Narrative Interpretation: Stable  Recent Labs:   11/17/2020: TC 148, TG 119, HDL 52, LDL 75; A1c 5.5.   10/10/2020: BUN 17 Cr 0.87.  Glucose 111 No results found for: CHOL, HDL, LDLCALC, LDLDIRECT, TRIG, CHOLHDL No results found for: CREATININE, BUN, NA, K, CL, CO2 No flowsheet data found.  No results found for: HGBA1C No results found for: TSH  ==================================================  COVID-19 Education: The signs and symptoms of COVID-19 were discussed with  the patient and how to seek care for testing (follow up with PCP or arrange E-visit).    I spent a total of 15 minutes with the patient spent in direct patient consultation.  Additional time spent with chart review  / charting (studies, outside notes, etc): 16 min Total Time: 31 min  Current medicines are reviewed at length with the patient today.  (+/- concerns) none  This visit occurred during the SARS-CoV-2 public health emergency.  Safety protocols were in place, including screening questions prior to the visit, additional usage of staff PPE, and extensive cleaning of exam room while observing appropriate contact time as indicated for disinfecting solutions.  Notice: This dictation was prepared with Dragon dictation along with smart phrase technology. Any transcriptional errors that result from this process are unintentional and may not be corrected upon review.  Patient Instructions / Medication Changes & Studies & Tests Ordered   Patient Instructions  Medication Instructions:   No changes *If you need a refill on your cardiac medications before your next appointment, please call your pharmacy*   Lab Work:  Not needed   Testing/Procedures:  Not needed  Follow-Up: At [facility], you and your health needs are our priority.  As part of our continuing mission to provide you with exceptional heart care, we have created designated Provider Care Teams.  These Care Teams include your primary Cardiologist (physician) and Advanced Practice Providers (APPs -  Physician Assistants and Nurse Practitioners) who all work together to provide you with the care you need, when you need it.     Your next appointment:   12 month(s)  The format for your next appointment:   In Person  Provider:   Glenetta Hew, MD   Other Instructions    Studies Ordered:   Orders Placed This Encounter  Procedures   Flu Vaccine QUAD High Dose(Fluad)   EKG 12-Lead     Glenetta Hew, M.D.,  M.S. Interventional Cardiologist   Pager # [phone] Phone # [phone] [address] [place], [place] [address]   Thank you for choosing [facility] at [facility]!!

## 2021-01-24 NOTE — Patient Instructions (Signed)

## 2021-02-01 DIAGNOSIS — R102 Pelvic and perineal pain: Secondary | ICD-10-CM | POA: Diagnosis not present

## 2021-02-01 DIAGNOSIS — R1030 Lower abdominal pain, unspecified: Secondary | ICD-10-CM | POA: Diagnosis not present

## 2021-02-01 DIAGNOSIS — E739 Lactose intolerance, unspecified: Secondary | ICD-10-CM | POA: Diagnosis not present

## 2021-02-01 DIAGNOSIS — R195 Other fecal abnormalities: Secondary | ICD-10-CM | POA: Diagnosis not present

## 2021-02-02 DIAGNOSIS — R1030 Lower abdominal pain, unspecified: Secondary | ICD-10-CM | POA: Diagnosis not present

## 2021-02-09 ENCOUNTER — Other Ambulatory Visit: Payer: Self-pay

## 2021-02-09 ENCOUNTER — Ambulatory Visit: Payer: Medicare Other | Attending: Internal Medicine

## 2021-02-09 ENCOUNTER — Other Ambulatory Visit (HOSPITAL_BASED_OUTPATIENT_CLINIC_OR_DEPARTMENT_OTHER): Payer: Self-pay

## 2021-02-09 DIAGNOSIS — Z23 Encounter for immunization: Secondary | ICD-10-CM

## 2021-02-09 MED ORDER — PFIZER COVID-19 VAC BIVALENT 30 MCG/0.3ML IM SUSP
INTRAMUSCULAR | 0 refills | Status: DC
  Filled 2021-02-09: qty 0.3, 1d supply, fill #0

## 2021-02-09 NOTE — Progress Notes (Signed)
   Covid-19 Vaccination Clinic  Name:  Tahja Liao    MRN: [id number] DOB: [birth year]  02/09/2021  Ms. Hohmann was observed post Covid-19 immunization for 15 minutes without incident. She was provided with Vaccine Information Sheet and instruction to access the V-Safe system.   Ms. Gaspar was instructed to call 911 with any severe reactions post vaccine: Difficulty breathing  Swelling of face and throat  A fast heartbeat  A bad rash all over body  Dizziness and weakness   Immunizations Administered     Name Date Dose VIS Date Route   Pfizer Covid-19 Vaccine Bivalent Booster 02/09/2021 10:13 AM 0.3 mL 12/06/2020 Intramuscular   Manufacturer: [employer]   Lot: [id number]   [place]: [phone]

## 2021-02-12 DIAGNOSIS — M150 Primary generalized (osteo)arthritis: Secondary | ICD-10-CM | POA: Diagnosis not present

## 2021-02-12 DIAGNOSIS — M25561 Pain in right knee: Secondary | ICD-10-CM | POA: Diagnosis not present

## 2021-02-12 DIAGNOSIS — M353 Polymyalgia rheumatica: Secondary | ICD-10-CM | POA: Diagnosis not present

## 2021-02-12 DIAGNOSIS — M0589 Other rheumatoid arthritis with rheumatoid factor of multiple sites: Secondary | ICD-10-CM | POA: Diagnosis not present

## 2021-02-12 DIAGNOSIS — G629 Polyneuropathy, unspecified: Secondary | ICD-10-CM | POA: Diagnosis not present

## 2021-02-12 DIAGNOSIS — Z6828 Body mass index (BMI) 28.0-28.9, adult: Secondary | ICD-10-CM | POA: Diagnosis not present

## 2021-02-12 DIAGNOSIS — E663 Overweight: Secondary | ICD-10-CM | POA: Diagnosis not present

## 2021-02-12 DIAGNOSIS — Z79899 Other long term (current) drug therapy: Secondary | ICD-10-CM | POA: Diagnosis not present

## 2021-02-13 DIAGNOSIS — N816 Rectocele: Secondary | ICD-10-CM | POA: Diagnosis not present

## 2021-02-13 DIAGNOSIS — R102 Pelvic and perineal pain: Secondary | ICD-10-CM | POA: Diagnosis not present

## 2021-02-13 DIAGNOSIS — N8110 Cystocele, unspecified: Secondary | ICD-10-CM | POA: Diagnosis not present

## 2021-02-15 ENCOUNTER — Other Ambulatory Visit: Payer: Self-pay | Admitting: Internal Medicine

## 2021-02-15 ENCOUNTER — Ambulatory Visit
Admission: RE | Admit: 2021-02-15 | Discharge: 2021-02-15 | Disposition: A | Discharge status: Home / Self Care | Payer: Medicare Other | Source: Ambulatory Visit | Attending: Internal Medicine | Admitting: Internal Medicine

## 2021-02-15 DIAGNOSIS — R1030 Lower abdominal pain, unspecified: Secondary | ICD-10-CM | POA: Diagnosis not present

## 2021-02-15 DIAGNOSIS — N816 Rectocele: Secondary | ICD-10-CM | POA: Diagnosis not present

## 2021-02-15 DIAGNOSIS — R102 Pelvic and perineal pain: Secondary | ICD-10-CM

## 2021-02-15 DIAGNOSIS — K449 Diaphragmatic hernia without obstruction or gangrene: Secondary | ICD-10-CM | POA: Diagnosis not present

## 2021-02-15 DIAGNOSIS — N8110 Cystocele, unspecified: Secondary | ICD-10-CM | POA: Diagnosis not present

## 2021-02-15 DIAGNOSIS — Q632 Ectopic kidney: Secondary | ICD-10-CM | POA: Diagnosis not present

## 2021-02-15 DIAGNOSIS — R630 Anorexia: Secondary | ICD-10-CM | POA: Diagnosis not present

## 2021-02-15 DIAGNOSIS — I728 Aneurysm of other specified arteries: Secondary | ICD-10-CM | POA: Diagnosis not present

## 2021-02-15 MED ORDER — IOPAMIDOL (ISOVUE-300) INJECTION 61%
100.0000 mL | Freq: Once | INTRAVENOUS | Status: AC | PRN
  Administered 2021-02-15: 100 mL via INTRAVENOUS

## 2021-02-18 ENCOUNTER — Encounter: Payer: Self-pay | Admitting: Cardiology

## 2021-02-18 NOTE — Assessment & Plan Note (Signed)
Blood pressure looks pretty good today.  It has been quite labile.  A little on the low side, would not be more aggressive even though she has some intermittent high levels.  He is only on irbesartan and HCTZ.  I indicated that if she feels lightheaded or dizzy, she should only take half a dose.

## 2021-02-18 NOTE — Assessment & Plan Note (Signed)
Notably improved.  Was likely related to deconditioning.  Now that she is more active, no longer having issue.

## 2021-02-18 NOTE — Assessment & Plan Note (Signed)
She is on low-dose atorvastatin, LDL 75.  Well within goal.

## 2021-02-18 NOTE — Assessment & Plan Note (Signed)
Not having any further episodes.  Not on beta-blocker because of bradycardia concerns. No longer using PRN beta-blocker.

## 2021-02-20 DIAGNOSIS — R102 Pelvic and perineal pain: Secondary | ICD-10-CM | POA: Diagnosis not present

## 2021-03-07 DIAGNOSIS — N8110 Cystocele, unspecified: Secondary | ICD-10-CM | POA: Diagnosis not present

## 2021-03-07 DIAGNOSIS — M069 Rheumatoid arthritis, unspecified: Secondary | ICD-10-CM | POA: Diagnosis not present

## 2021-03-07 DIAGNOSIS — R232 Flushing: Secondary | ICD-10-CM | POA: Diagnosis not present

## 2021-03-08 DIAGNOSIS — R232 Flushing: Secondary | ICD-10-CM | POA: Diagnosis not present

## 2021-03-09 DIAGNOSIS — R232 Flushing: Secondary | ICD-10-CM | POA: Diagnosis not present

## 2021-03-12 DIAGNOSIS — M0589 Other rheumatoid arthritis with rheumatoid factor of multiple sites: Secondary | ICD-10-CM | POA: Diagnosis not present

## 2021-03-12 DIAGNOSIS — Z79899 Other long term (current) drug therapy: Secondary | ICD-10-CM | POA: Diagnosis not present

## 2021-03-12 DIAGNOSIS — M150 Primary generalized (osteo)arthritis: Secondary | ICD-10-CM | POA: Diagnosis not present

## 2021-03-12 DIAGNOSIS — M353 Polymyalgia rheumatica: Secondary | ICD-10-CM | POA: Diagnosis not present

## 2021-03-12 DIAGNOSIS — E663 Overweight: Secondary | ICD-10-CM | POA: Diagnosis not present

## 2021-03-12 DIAGNOSIS — Z6828 Body mass index (BMI) 28.0-28.9, adult: Secondary | ICD-10-CM | POA: Diagnosis not present

## 2021-03-12 DIAGNOSIS — M25561 Pain in right knee: Secondary | ICD-10-CM | POA: Diagnosis not present

## 2021-03-12 DIAGNOSIS — G629 Polyneuropathy, unspecified: Secondary | ICD-10-CM | POA: Diagnosis not present

## 2021-03-15 DIAGNOSIS — G629 Polyneuropathy, unspecified: Secondary | ICD-10-CM | POA: Diagnosis not present

## 2021-03-20 DIAGNOSIS — R232 Flushing: Secondary | ICD-10-CM | POA: Diagnosis not present

## 2021-05-02 ENCOUNTER — Ambulatory Visit: Payer: Medicare Other | Admitting: Obstetrics and Gynecology

## 2021-05-16 DIAGNOSIS — R232 Flushing: Secondary | ICD-10-CM | POA: Diagnosis not present

## 2021-05-16 DIAGNOSIS — Z808 Family history of malignant neoplasm of other organs or systems: Secondary | ICD-10-CM | POA: Diagnosis not present

## 2021-05-16 DIAGNOSIS — D7589 Other specified diseases of blood and blood-forming organs: Secondary | ICD-10-CM | POA: Diagnosis not present

## 2021-05-16 DIAGNOSIS — R1030 Lower abdominal pain, unspecified: Secondary | ICD-10-CM | POA: Diagnosis not present

## 2021-05-16 DIAGNOSIS — R102 Pelvic and perineal pain: Secondary | ICD-10-CM | POA: Diagnosis not present

## 2021-05-16 DIAGNOSIS — Z87898 Personal history of other specified conditions: Secondary | ICD-10-CM | POA: Diagnosis not present

## 2021-05-21 DIAGNOSIS — I10 Essential (primary) hypertension: Secondary | ICD-10-CM | POA: Diagnosis not present

## 2021-05-21 DIAGNOSIS — M069 Rheumatoid arthritis, unspecified: Secondary | ICD-10-CM | POA: Diagnosis not present

## 2021-05-21 DIAGNOSIS — F3341 Major depressive disorder, recurrent, in partial remission: Secondary | ICD-10-CM | POA: Diagnosis not present

## 2021-05-21 DIAGNOSIS — Z1211 Encounter for screening for malignant neoplasm of colon: Secondary | ICD-10-CM | POA: Diagnosis not present

## 2021-05-21 DIAGNOSIS — E7800 Pure hypercholesterolemia, unspecified: Secondary | ICD-10-CM | POA: Diagnosis not present

## 2021-05-21 DIAGNOSIS — Q632 Ectopic kidney: Secondary | ICD-10-CM | POA: Diagnosis not present

## 2021-06-04 DIAGNOSIS — D7589 Other specified diseases of blood and blood-forming organs: Secondary | ICD-10-CM | POA: Diagnosis not present

## 2021-06-04 DIAGNOSIS — R102 Pelvic and perineal pain: Secondary | ICD-10-CM | POA: Diagnosis not present

## 2021-06-04 DIAGNOSIS — Z87898 Personal history of other specified conditions: Secondary | ICD-10-CM | POA: Diagnosis not present

## 2021-06-04 DIAGNOSIS — R1030 Lower abdominal pain, unspecified: Secondary | ICD-10-CM | POA: Diagnosis not present

## 2021-06-04 DIAGNOSIS — R232 Flushing: Secondary | ICD-10-CM | POA: Diagnosis not present

## 2021-06-12 DIAGNOSIS — G629 Polyneuropathy, unspecified: Secondary | ICD-10-CM | POA: Diagnosis not present

## 2021-06-12 DIAGNOSIS — E663 Overweight: Secondary | ICD-10-CM | POA: Diagnosis not present

## 2021-06-12 DIAGNOSIS — M25561 Pain in right knee: Secondary | ICD-10-CM | POA: Diagnosis not present

## 2021-06-12 DIAGNOSIS — Z6827 Body mass index (BMI) 27.0-27.9, adult: Secondary | ICD-10-CM | POA: Diagnosis not present

## 2021-06-12 DIAGNOSIS — M0589 Other rheumatoid arthritis with rheumatoid factor of multiple sites: Secondary | ICD-10-CM | POA: Diagnosis not present

## 2021-06-12 DIAGNOSIS — M150 Primary generalized (osteo)arthritis: Secondary | ICD-10-CM | POA: Diagnosis not present

## 2021-06-12 DIAGNOSIS — M353 Polymyalgia rheumatica: Secondary | ICD-10-CM | POA: Diagnosis not present

## 2021-06-12 DIAGNOSIS — Z79899 Other long term (current) drug therapy: Secondary | ICD-10-CM | POA: Diagnosis not present

## 2021-06-22 DIAGNOSIS — Z1211 Encounter for screening for malignant neoplasm of colon: Secondary | ICD-10-CM | POA: Diagnosis not present

## 2021-06-22 DIAGNOSIS — R1084 Generalized abdominal pain: Secondary | ICD-10-CM | POA: Diagnosis not present

## 2021-06-26 ENCOUNTER — Other Ambulatory Visit: Payer: Self-pay

## 2021-06-26 ENCOUNTER — Ambulatory Visit (INDEPENDENT_AMBULATORY_CARE_PROVIDER_SITE_OTHER): Payer: Medicare Other | Admitting: Obstetrics and Gynecology

## 2021-06-26 ENCOUNTER — Encounter: Payer: Self-pay | Admitting: Obstetrics and Gynecology

## 2021-06-26 VITALS — BP 101/66 | HR 81 | Ht 62.0 in | Wt 158.0 lb

## 2021-06-26 DIAGNOSIS — R350 Frequency of micturition: Secondary | ICD-10-CM

## 2021-06-26 DIAGNOSIS — M62838 Other muscle spasm: Secondary | ICD-10-CM | POA: Diagnosis not present

## 2021-06-26 DIAGNOSIS — N816 Rectocele: Secondary | ICD-10-CM | POA: Diagnosis not present

## 2021-06-26 LAB — POCT URINALYSIS DIPSTICK
Appearance: NORMAL
Bilirubin, UA: NEGATIVE
Blood, UA: NEGATIVE
Glucose, UA: NEGATIVE
Ketones, UA: NEGATIVE
Leukocytes, UA: NEGATIVE
Nitrite, UA: NEGATIVE
Protein, UA: NEGATIVE
Spec Grav, UA: 1.025 (ref 1.010–1.025)
Urobilinogen, UA: 0.2 E.U./dL
pH, UA: 6 (ref 5.0–8.0)

## 2021-06-26 NOTE — Progress Notes (Signed)
[facility] ?New Patient Evaluation and Consultation ? ?Referring Provider: Leeroy Cha,* ?PCP: Leeroy Cha, MD ?Date of Service: 06/26/2021 ? ?SUBJECTIVE ?Chief Complaint: New Patient (Initial Visit) April Terry is a 81 y.o. female here for a consult on pelvic pressure. Pt said it was feeling heavy for 6 weeks. Pt said the pain is better now) ? ?History of Present Illness: April Terry is a 81 y.o. White or Caucasian female seen in consultation at the request of Dr. Fara Olden for evaluation of pelvic pressure.   ? ?Review of records significant for: ?Has been having pelvic pain/ pressure.  ? ?CT A/P 02/15/21:  ?IMPRESSION: ?No acute intra-abdominal findings. ?  ?Mild circumferential distal esophageal thickening may mild represent ?esophagitis. Small hiatal hernia. Correlate with any symptoms that ?would suggest esophagitis. ?  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 Consider follow-up imaging at 1 year ?or comparison with prior studies to document stability. ?  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 ?  ? ?Urinary Symptoms: ?Does not leak urine.  ? ?Day time voids 5.  Nocturia: 0 times per night to void. ?Voiding dysfunction: she empties her bladder well.  ?does not use a catheter to empty bladder.  ?When urinating, she feels she has no difficulties ? ? ?UTIs:  0  UTI's in the last year.   ?Denies history of blood in urine and kidney or bladder stones ? ?Pelvic Organ Prolapse Symptoms:                  ?She Denies a feeling of a bulge the vaginal area.  ? ?Bowel Symptom: ?Bowel movements: 1-2 time(s) per day ?Stool consistency: soft  ?Straining: no.  ?Splinting: no.  ?Incomplete evacuation: no.  ?She Denies accidental bowel leakage / fecal incontinence ?Bowel regimen: none ? ? ?Sexual  Function ?Sexually active: no.  ? ?Pelvic Pain ? ?A few months ago, started having pressure and discomfort in whole abdomen pressing into pelvic area. Felt difficult to walk around due to pressure. Felt like she needed to hold her abdomen up. Felt like she was pregnant. More on the left side. Last a few months daily, has been better recently. Saw GYN and did not see issues with the ovaries.  ? ? ?Past Medical History:  ?Past Medical History:  ?Diagnosis Date  ? Essential hypertension   ? H/O polymyalgia rheumatica   ? Was symptoms have mostly resolved after prolonged treatment with steroids  ? Hyperlipidemia   ? Well-controlled on fish oil and low-dose Lipitor.  ? Rheumatoid arthritis involving both hands ([place])   ? Situational mixed anxiety and depressive disorder   ? ? ? ?Past Surgical History:   ?Past Surgical History:  ?Procedure Laterality Date  ? ABDOMINAL HYSTERECTOMY  1984  ? APPENDECTOMY  1950  ? TRANSTHORACIC ECHOCARDIOGRAM  02/2012  ? Mild concentric hypertrophy.  EF 55-60%.  No regional wall motion normality.  No mitral prolapse noted.  ? ? ? ?Past OB/GYN History: ?OB History  ?Gravida Para Term Preterm AB Living  ?'2 2 2     2  '$ ?SAB IAB Ectopic Multiple Live Births  ?        2  ?  ?# Outcome Date GA Lbr Len/2nd Weight Sex Delivery Anes PTL Lv  ?2 Term      Vag-Spont     ?1 Term  Vag-Spont     ? ?S/p hysterectomy ? ? ?Medications: She has a current medication list which includes the following prescription(s): atorvastatin, calcium carb-cholecalciferol, chlorhexidine, cholecalciferol, pfizer covid-19 vac bivalent, irbesartan-hydrochlorothiazide, magnesium, meloxicam, multivitamin with minerals, sertraline, sulfasalazine, and UNABLE TO FIND.  ? ?Allergies: Patient has No Known Allergies.  ? ?Social History:  ?Social History  ? ?Tobacco Use  ? Smoking status: Never  ? Smokeless tobacco: Never  ?Vaping Use  ? Vaping Use: Never used  ?Substance Use Topics  ? Alcohol use: Yes  ?  Alcohol/week: 2.0 standard  drinks  ?  Types: 2 Glasses of wine per week  ?  Comment: Rare, occasional  ? Drug use: No  ? ? ?Relationship status: married ?She lives with husband.   ?She is not employed. ?Regular exercise: No ?History of abuse: No ? ?Family History:   ?Family History  ?Problem Relation Age of Onset  ? Heart failure Mother   ? Heart failure Sister   ? Arthritis Brother   ? Obesity Brother   ? ? ? ?Review of Systems: Review of Systems  ?Constitutional:  Positive for weight loss. Negative for fever and malaise/fatigue.  ?Respiratory:  Negative for cough, shortness of breath and wheezing.   ?Cardiovascular:  Negative for chest pain, palpitations and leg swelling.  ?Gastrointestinal:  Negative for abdominal pain and blood in stool.  ?Genitourinary:  Negative for dysuria.  ?Musculoskeletal:  Negative for myalgias.  ?Skin:  Negative for rash.  ?Neurological:  Negative for dizziness and headaches.  ?Endo/Heme/Allergies:  Bruises/bleeds easily.  ?Psychiatric/Behavioral:  Negative for depression. The patient is not nervous/anxious.   ? ? ?OBJECTIVE ?Physical Exam: ?Vitals:  ? 06/26/21 1317  ?BP: 101/66  ?Pulse: 81  ?SpO2: 97%  ?Weight: 158 lb (71.7 kg)  ?Height: '5\' 2"'$  (1.575 m)  ? ? ?Physical Exam ?Constitutional:   ?   General: She is not in acute distress. ?Pulmonary:  ?   Effort: Pulmonary effort is normal.  ?Abdominal:  ?   General: There is no distension.  ?   Palpations: Abdomen is soft.  ?   Tenderness: There is no abdominal tenderness. There is no rebound.  ?Musculoskeletal:     ?   General: No swelling. Normal range of motion.  ?Skin: ?   General: Skin is warm and dry.  ?   Findings: No rash.  ?Neurological:  ?   Mental Status: She is alert and oriented to person, place, and time.  ?Psychiatric:     ?   Mood and Affect: Mood normal.     ?   Behavior: Behavior normal.  ? ? ? ?GU / Detailed Urogynecologic Evaluation:  ?Pelvic Exam: Normal external female genitalia; Bartholin's and Skene's glands normal in appearance; urethral  meatus normal in appearance, no urethral masses or discharge.  ? ?CST: negative ? ?s/p hysterectomy: Speculum exam reveals normal vaginal mucosa with  atrophy and normal vaginal cuff.  Adnexa no mass, fullness, tenderness.   ? ? ?Pelvic floor strength I/V, ? ?Pelvic floor musculature: Right levator non-tender, Right obturator non-tender, Left levator non-tender, Left obturator tender ? ?POP-Q:  ? ?POP-Q ? ?-3  ?                                          Aa   ?-3 ?  Ba  ?-9  ?                                            C  ? ?2  ?                                          Gh  ?4  ?                                          Pb  ?9  ?                                          tvl  ? ?-2  ?                                          Ap  ?-2  ?                                          Bp  ?   ?                                            D  ? ? ? ?Rectal Exam:  ?Normal external rectum ? ?Post-Void Residual (PVR) by Bladder Scan: ?In order to evaluate bladder emptying, we discussed obtaining a postvoid residual and she agreed to this procedure. ? ?Procedure: The ultrasound unit was placed on the patient's abdomen in the suprapubic region after the patient had voided. A PVR of 9 ml was obtained by bladder scan. ? ?Laboratory Results: ?POC urine: negative ? ? ?ASSESSMENT AND PLAN ?Ms. Blakley is a 81 y.o. with:  ?1. Prolapse of posterior vaginal wall   ?2. Urinary frequency   ?3. Levator spasm   ? ?- Has some mild asymptomatic posterior vaginal wall prolapse and some mild tenderness of the pelvic floor muscles on the left. However, this does not seem to explain her significant pain/ pressure sensation.  ?- We discussed that it could be musculoskeletal since she did have some tenderness on exam, and she may benefit from a pelvic PT referral. However, she declines at this time since her symptoms have been improving.  ? ?Return as needed if symptoms worsen ? ? ?Jaquita Folds, MD ? ? ? ? ?

## 2021-11-16 DIAGNOSIS — M0589 Other rheumatoid arthritis with rheumatoid factor of multiple sites: Secondary | ICD-10-CM | POA: Diagnosis not present

## 2021-11-16 DIAGNOSIS — Z79899 Other long term (current) drug therapy: Secondary | ICD-10-CM | POA: Diagnosis not present

## 2021-11-19 DIAGNOSIS — M545 Low back pain, unspecified: Secondary | ICD-10-CM | POA: Diagnosis not present

## 2021-11-19 DIAGNOSIS — Z90710 Acquired absence of both cervix and uterus: Secondary | ICD-10-CM | POA: Diagnosis not present

## 2021-11-19 DIAGNOSIS — E559 Vitamin D deficiency, unspecified: Secondary | ICD-10-CM | POA: Diagnosis not present

## 2021-11-19 DIAGNOSIS — R059 Cough, unspecified: Secondary | ICD-10-CM | POA: Diagnosis not present

## 2021-11-19 DIAGNOSIS — I10 Essential (primary) hypertension: Secondary | ICD-10-CM | POA: Diagnosis not present

## 2021-11-19 DIAGNOSIS — E7800 Pure hypercholesterolemia, unspecified: Secondary | ICD-10-CM | POA: Diagnosis not present

## 2021-11-19 DIAGNOSIS — M85859 Other specified disorders of bone density and structure, unspecified thigh: Secondary | ICD-10-CM | POA: Diagnosis not present

## 2021-11-19 DIAGNOSIS — M069 Rheumatoid arthritis, unspecified: Secondary | ICD-10-CM | POA: Diagnosis not present

## 2021-11-19 DIAGNOSIS — M353 Polymyalgia rheumatica: Secondary | ICD-10-CM | POA: Diagnosis not present

## 2021-11-19 DIAGNOSIS — G629 Polyneuropathy, unspecified: Secondary | ICD-10-CM | POA: Diagnosis not present

## 2021-11-19 DIAGNOSIS — F3341 Major depressive disorder, recurrent, in partial remission: Secondary | ICD-10-CM | POA: Diagnosis not present

## 2021-11-19 DIAGNOSIS — Z0000 Encounter for general adult medical examination without abnormal findings: Secondary | ICD-10-CM | POA: Diagnosis not present

## 2021-11-19 LAB — TSH: TSH: 4.61 (ref 0.41–5.90)

## 2021-11-19 LAB — VITAMIN D 25 HYDROXY (VIT D DEFICIENCY, FRACTURES)
Vit D, 25-Hydroxy: 27.3
Vit D, 25-Hydroxy: 27.3

## 2021-11-26 DIAGNOSIS — H52203 Unspecified astigmatism, bilateral: Secondary | ICD-10-CM | POA: Diagnosis not present

## 2021-11-26 DIAGNOSIS — H16223 Keratoconjunctivitis sicca, not specified as Sjogren's, bilateral: Secondary | ICD-10-CM | POA: Diagnosis not present

## 2021-11-26 DIAGNOSIS — H5203 Hypermetropia, bilateral: Secondary | ICD-10-CM | POA: Diagnosis not present

## 2021-11-27 ENCOUNTER — Other Ambulatory Visit: Payer: Self-pay | Admitting: Internal Medicine

## 2021-11-27 DIAGNOSIS — Z1231 Encounter for screening mammogram for malignant neoplasm of breast: Secondary | ICD-10-CM

## 2021-11-28 DIAGNOSIS — M0589 Other rheumatoid arthritis with rheumatoid factor of multiple sites: Secondary | ICD-10-CM | POA: Diagnosis not present

## 2021-11-28 DIAGNOSIS — Z6827 Body mass index (BMI) 27.0-27.9, adult: Secondary | ICD-10-CM | POA: Diagnosis not present

## 2021-11-28 DIAGNOSIS — M25561 Pain in right knee: Secondary | ICD-10-CM | POA: Diagnosis not present

## 2021-11-28 DIAGNOSIS — E663 Overweight: Secondary | ICD-10-CM | POA: Diagnosis not present

## 2021-11-29 ENCOUNTER — Other Ambulatory Visit: Payer: Self-pay | Admitting: Internal Medicine

## 2021-11-29 DIAGNOSIS — M85859 Other specified disorders of bone density and structure, unspecified thigh: Secondary | ICD-10-CM

## 2021-12-04 ENCOUNTER — Ambulatory Visit
Admission: RE | Admit: 2021-12-04 | Discharge: 2021-12-04 | Disposition: A | Discharge status: Home / Self Care | Payer: Medicare Other | Source: Ambulatory Visit | Attending: Internal Medicine | Admitting: Internal Medicine

## 2021-12-04 ENCOUNTER — Other Ambulatory Visit: Payer: Self-pay | Admitting: Internal Medicine

## 2021-12-04 DIAGNOSIS — R052 Subacute cough: Secondary | ICD-10-CM

## 2021-12-04 DIAGNOSIS — R202 Paresthesia of skin: Secondary | ICD-10-CM | POA: Diagnosis not present

## 2021-12-04 DIAGNOSIS — I10 Essential (primary) hypertension: Secondary | ICD-10-CM | POA: Diagnosis not present

## 2021-12-04 DIAGNOSIS — R059 Cough, unspecified: Secondary | ICD-10-CM | POA: Diagnosis not present

## 2021-12-04 DIAGNOSIS — E559 Vitamin D deficiency, unspecified: Secondary | ICD-10-CM | POA: Diagnosis not present

## 2021-12-04 DIAGNOSIS — M069 Rheumatoid arthritis, unspecified: Secondary | ICD-10-CM | POA: Diagnosis not present

## 2021-12-04 DIAGNOSIS — R7989 Other specified abnormal findings of blood chemistry: Secondary | ICD-10-CM | POA: Diagnosis not present

## 2021-12-06 ENCOUNTER — Encounter: Payer: Self-pay | Admitting: Neurology

## 2021-12-18 DIAGNOSIS — M0589 Other rheumatoid arthritis with rheumatoid factor of multiple sites: Secondary | ICD-10-CM | POA: Diagnosis not present

## 2021-12-18 DIAGNOSIS — E669 Obesity, unspecified: Secondary | ICD-10-CM | POA: Diagnosis not present

## 2021-12-18 DIAGNOSIS — M25561 Pain in right knee: Secondary | ICD-10-CM | POA: Diagnosis not present

## 2021-12-18 DIAGNOSIS — Z6827 Body mass index (BMI) 27.0-27.9, adult: Secondary | ICD-10-CM | POA: Diagnosis not present

## 2021-12-18 DIAGNOSIS — G629 Polyneuropathy, unspecified: Secondary | ICD-10-CM | POA: Diagnosis not present

## 2021-12-18 DIAGNOSIS — M353 Polymyalgia rheumatica: Secondary | ICD-10-CM | POA: Diagnosis not present

## 2021-12-18 DIAGNOSIS — Z79899 Other long term (current) drug therapy: Secondary | ICD-10-CM | POA: Diagnosis not present

## 2021-12-18 DIAGNOSIS — M150 Primary generalized (osteo)arthritis: Secondary | ICD-10-CM | POA: Diagnosis not present

## 2021-12-20 ENCOUNTER — Ambulatory Visit: Payer: Medicare Other

## 2022-01-01 DIAGNOSIS — R7989 Other specified abnormal findings of blood chemistry: Secondary | ICD-10-CM | POA: Diagnosis not present

## 2022-01-01 DIAGNOSIS — R946 Abnormal results of thyroid function studies: Secondary | ICD-10-CM | POA: Diagnosis not present

## 2022-01-03 DIAGNOSIS — Z23 Encounter for immunization: Secondary | ICD-10-CM | POA: Diagnosis not present

## 2022-01-03 DIAGNOSIS — E063 Autoimmune thyroiditis: Secondary | ICD-10-CM | POA: Diagnosis not present

## 2022-01-03 DIAGNOSIS — E559 Vitamin D deficiency, unspecified: Secondary | ICD-10-CM | POA: Diagnosis not present

## 2022-01-16 ENCOUNTER — Ambulatory Visit
Admission: RE | Admit: 2022-01-16 | Discharge: 2022-01-16 | Disposition: A | Discharge status: Home / Self Care | Payer: Medicare Other | Source: Ambulatory Visit | Attending: Internal Medicine | Admitting: Internal Medicine

## 2022-01-16 DIAGNOSIS — Z1231 Encounter for screening mammogram for malignant neoplasm of breast: Secondary | ICD-10-CM | POA: Diagnosis not present

## 2022-01-18 ENCOUNTER — Other Ambulatory Visit (HOSPITAL_BASED_OUTPATIENT_CLINIC_OR_DEPARTMENT_OTHER): Payer: Self-pay

## 2022-01-18 DIAGNOSIS — Z23 Encounter for immunization: Secondary | ICD-10-CM | POA: Diagnosis not present

## 2022-01-18 MED ORDER — COVID-19 MRNA 2023-2024 VACCINE (COMIRNATY) 0.3 ML INJECTION
INTRAMUSCULAR | 0 refills | Status: DC
  Filled 2022-01-18: qty 0.3, 1d supply, fill #0

## 2022-02-05 NOTE — Progress Notes (Signed)
Initial neurology clinic note  SERVICE DATE: 02/08/2022  Reason for Evaluation: Consultation requested by Leeroy Cha,* for an opinion regarding tingling in feet and ankles. My final recommendations will be communicated back to the requesting physician by way of shared medical record or letter to requesting physician via [place] mail.  HPI: This is Ms. Octavio Manns, a 81 y.o. right-handed female with a medical history of HTN, HLD, RA (on sulfasalazine for 15 years), PMR, right sided sciatica, vit D deficiency, depression who presents to neurology clinic with the chief complaint of tingling in bilateral feet and ankles. The patient is alone today.  Patient has had symptoms for 10 years. She describes tingling in her feet and ankles. She describes feeling like paper is wrapped around her toes and occasional electric shocks. She also does not feel as steady on her feet as previous. Her toes can occasionally be sensitive to touch. Over the years, it has bothered patient more. She thinks it may be a little worse on the right foot compared to the left. She has not had any recent falls. She does not use an assistive device. She saw a neurologist in 2022. There was to be testing but patient did not follow up.  There no labs, imaging, or EMG that I can see.  She has no symptoms in her hands or arms. She does endorse occasional lower back pain but no radiation.  Per patient, symptoms are not currently affecting her lifestyle. She has never been on a medication for her symptoms and her symptoms are not painful enough currently that she would want symptomatic treatment.  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 This has been doing well, particularly with cleaning up her diet.  Patient is on sertraline 50 mg for depression.  The patient has not noticed any recent skin rashes nor does she report any constitutional symptoms like fever, night sweats, anorexia or  unintentional weight loss.  EtOH use: 1 glass of wine per month  Restrictive diet? No Family history of neuropathy/myopathy/NM disease? Not known  Patient has not been on any supplements for at least 6 months (was on a multivitamin and vit D previously).   MEDICATIONS:  Outpatient Encounter Medications as of 02/08/2022  Medication Sig   atorvastatin (LIPITOR) 10 MG tablet Take 10 mg by mouth daily.   Calcium Carb-Cholecalciferol 1000-800 MG-UNIT TABS Take 1 tablet by mouth daily.   Cholecalciferol 25 MCG (1000 UT) capsule Take 1,000 Units by mouth daily.   COVID-19 mRNA bivalent vaccine, Pfizer, (PFIZER COVID-19 VAC BIVALENT) injection Inject into the muscle.   COVID-19 mRNA vaccine 2023-2024 (COMIRNATY) SUSP injection Inject into the muscle.   cycloSPORINE (RESTASIS) 0.05 % ophthalmic emulsion 1 drop 2 (two) times daily.   irbesartan-hydrochlorothiazide (AVALIDE) 150-12.5 MG tablet TK 1 T PO QD IN THE EVE   meloxicam (MOBIC) 15 MG tablet Take 15 mg by mouth as needed.   Multiple Vitamins-Minerals (MULTIVITAMIN WITH MINERALS) tablet Take 1 tablet by mouth daily.   sertraline (ZOLOFT) 100 MG tablet Take 1 tablet by mouth daily. Pt now taking half tablet for 50 mg   sulfaSALAzine (AZULFIDINE) 500 MG EC tablet Take 4 tablets by mouth daily.   UNABLE TO FIND Med Name: ocuivite po take one tablet a day   chlorhexidine (PERIDEX) 0.12 % solution 15 mLs 2 (two) times daily.   Magnesium 500 MG TABS Take 1 tablet by mouth daily.   No facility-administered encounter medications on file as of 02/08/2022.  PAST MEDICAL HISTORY: Past Medical History:  Diagnosis Date   Essential hypertension    H/O polymyalgia rheumatica    Was symptoms have mostly resolved after prolonged treatment with steroids   Hyperlipidemia    Well-controlled on fish oil and low-dose Lipitor.   Rheumatoid arthritis involving both hands ([place])    Situational mixed anxiety and depressive disorder     PAST SURGICAL  HISTORY: Past Surgical History:  Procedure Laterality Date   ABDOMINAL HYSTERECTOMY  1984   APPENDECTOMY  1950   TRANSTHORACIC ECHOCARDIOGRAM  02/2012   Mild concentric hypertrophy.  EF 55-60%.  No regional wall motion normality.  No mitral prolapse noted.    ALLERGIES: No Known Allergies  FAMILY HISTORY: Family History  Problem Relation Age of Onset   Heart failure Mother    Heart failure Sister    Arthritis Brother    Obesity Brother     SOCIAL HISTORY: Social History   Tobacco Use   Smoking status: Never   Smokeless tobacco: Never  Vaping Use   Vaping Use: Never used  Substance Use Topics   Alcohol use: Yes    Alcohol/week: 2.0 standard drinks of alcohol    Types: 2 Glasses of wine per week    Comment: Rare, occasional   Drug use: No   Social History   Social History Narrative   Married [name]) mother of 2, 3 grandchildren and 2 great-grandchildren   Are you right handed or left handed? Right   Are you currently employed ? no      Do you live at home alone? Live with husband of 61 years    Caffeine daily 2 cup   What type of home do you live in: 1 story or 2 story? one         OBJECTIVE: PHYSICAL EXAM: BP 120/67   Pulse 89   Ht '5\' 3"'$  (1.6 m)   Wt 155 lb (70.3 kg)   SpO2 98%   BMI 27.46 kg/m   General: General appearance: Awake and alert. No distress. Cooperative with exam.  Skin: No obvious rash or jaundice. HEENT: Atraumatic. Anicteric. Lungs: Non-labored breathing on room air  Extremities: No edema. No obvious deformity.  Musculoskeletal: No obvious joint swelling. Psych: Affect appropriate.  Neurological: Mental Status: Alert. Speech fluent. No pseudobulbar affect Cranial Nerves: CNII: No RAPD. Visual fields grossly intact. CNIII, IV, VI: PERRL. No nystagmus. EOMI. CN V: Facial sensation intact bilaterally to fine touch. CN VII: Facial muscles symmetric and strong. No ptosis at rest. CN VIII: Hearing grossly intact bilaterally. CN  IX: No hypophonia. CN X: Palate elevates symmetrically. CN XI: Full strength shoulder shrug bilaterally. CN XII: Tongue protrusion full and midline. No atrophy or fasciculations. No significant dysarthria Motor: Tone is normal. No atrophy.  Individual muscle group testing (MRC grade out of 5):  Movement     Neck flexion 5    Neck extension 5     Right Left   Shoulder abduction 5 5   Shoulder adduction 5 5   Shoulder ext rotation 5 5   Shoulder int rotation 5 5   Elbow flexion 5 5   Elbow extension 5 5   Wrist extension 5 5   Wrist flexion 5 5   Finger abduction - FDI 5 5   Finger abduction - ADM 5 5   Finger extension 5 5   Finger distal flexion - 2/'3 5 5   '$ Finger distal flexion - 4/'5 5 5   '$ Thumb flexion -  FPL 5 5   Thumb abduction - APB 5 5    Hip flexion 5 5   Hip extension 5 5   Hip adduction 5 5   Hip abduction 5 5   Knee extension 5 5   Knee flexion 5 5   Dorsiflexion 5 5   Plantarflexion 5 5   Inversion 5 5   Eversion 5 5   Great toe extension 5- 5-   Great toe flexion 4 4     Reflexes:  Right Left   Bicep 2+ 2+   Tricep 2+ 2+   BrRad 2+ 2+   Knee 2+ 2+   Ankle 0 0    Pathological Reflexes: Babinski: mute response bilaterally Hoffman: Absent bilaterally Troemner: absent bilaterally Sensation: Pinprick: 75% of normal in right finger tips, 90% of normal in left finger tips; absent in bilateral feet, increases as going proximal to upper calves Vibration: Intact in upper extremities. 2-3 seconds in bilateral great toes, 8 seconds in bilateral ankles Proprioception: Diminished in right great toe, intact in left great toe Coordination: Intact finger-to- nose-finger bilaterally. Romberg negative. Gait: Able to rise from chair with arms crossed unassisted. Normal, narrow-based gait. Able to tandem walk. Able to walk on toes and heels.  Lab and Test Review: External labs: Normal or unremarkable: CMP CBC significant for MCV of 102  ASSESSMENT: Rhylan Gross is a 81 y.o. female who presents for evaluation of numbness and tingling in feet. She has a relevant medical history of HTN, HLD, RA, PMR, right sided sciatica, vit D deficiency, depression. Her neurological examination is pertinent for diminished sensation in bilateral lower extremities and in bilateral hands (right greater than left). I have no previous testing (labs or otherwise) to evaluate. 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 There is some asymmetry, particularly in the feet that may be due to prior right sided sciatica or radiculopathy. I will do an EMG to establish the diagnosis of neuropathy and look for overlapping pathology. I will also send labs to look for treatable causes as I do not currently have known risk factors for neuropathy. Patient did not feel her symptoms warranted treatment at this time.  PLAN: -Blood work: B12, IFE. Attempted to order HbA1c but not covered by insurance -EMG: PN protocol (R > L) -Foot care discussed  -Return to clinic in 1 year  The impression above as well as the plan as outlined below were extensively discussed with the patient who voiced understanding. All questions were answered to their satisfaction.  The patient was counseled on pertinent fall precautions per the printed material provided today, and as noted under the "Patient Instructions" section below.  When available, results of the above investigations and possible further recommendations will be communicated to the patient via telephone/MyChart. Patient to call office if not contacted after expected testing turnaround time.   Total time spent reviewing records, interview, history/exam, documentation, and coordination of care on day of encounter:  60 min   Thank you for allowing me to participate in patient's care.  If I can answer any additional questions, I would be pleased to do so.  Kai Levins, MD   CC: Leeroy Cha, MD [address]  [place] [address]  CC: Referring provider: Leeroy Cha, MD [address] [place],  [place] [address]

## 2022-02-08 ENCOUNTER — Other Ambulatory Visit (INDEPENDENT_AMBULATORY_CARE_PROVIDER_SITE_OTHER): Payer: Medicare Other

## 2022-02-08 ENCOUNTER — Ambulatory Visit (INDEPENDENT_AMBULATORY_CARE_PROVIDER_SITE_OTHER): Payer: Medicare Other | Admitting: Neurology

## 2022-02-08 ENCOUNTER — Encounter: Payer: Self-pay | Admitting: Neurology

## 2022-02-08 VITALS — BP 120/67 | HR 89 | Ht 63.0 in | Wt 155.0 lb

## 2022-02-08 DIAGNOSIS — R209 Unspecified disturbances of skin sensation: Secondary | ICD-10-CM

## 2022-02-08 DIAGNOSIS — G629 Polyneuropathy, unspecified: Secondary | ICD-10-CM

## 2022-02-08 DIAGNOSIS — R2681 Unsteadiness on feet: Secondary | ICD-10-CM

## 2022-02-08 LAB — VITAMIN B12: Vitamin B-12: 394 pg/mL (ref 211–911)

## 2022-02-08 NOTE — Patient Instructions (Addendum)
Your symptoms are likely due to nerve damage called neuropathy. I would like to do labs today to look for treatable causes.  I would also like to do a muscle and nerve test called EMG to further investigate (more information below). You can schedule this prior to leaving.  I will be in touch when I have your results.  Check your feet daily as you may not feel that you have injured them.  I want to see you back in clinic in 1 year or sooner if needed.  The physicians and staff at [facility] are committed to providing excellent care. You may receive a survey requesting feedback about your experience at our office. We strive to receive "very good" responses to the survey questions. If you feel that your experience would prevent you from giving the office a "very good " response, please contact our office to try to remedy the situation. We may be reached at [phone]. Thank you for taking the time out of your busy day to complete the survey.  Kai Levins, MD [facility]   ELECTROMYOGRAM AND NERVE CONDUCTION STUDIES (EMG/NCS) INSTRUCTIONS  How to Prepare The neurologist conducting the EMG will need to know if you have certain medical conditions. Tell the neurologist and other EMG lab personnel if you: Have a pacemaker or any other electrical medical device Take blood-thinning medications Have hemophilia, a blood-clotting disorder that causes prolonged bleeding Bathing Take a shower or bath shortly before your exam in order to remove oils from your skin. Don't apply lotions or creams before the exam.  What to Expect You'll likely be asked to change into a hospital gown for the procedure and lie down on an examination table. The following explanations can help you understand what will happen during the exam.  Electrodes. The neurologist or a technician places surface electrodes at various locations on your skin depending on where you're experiencing symptoms. Or the neurologist  may insert needle electrodes at different sites depending on your symptoms.  Sensations. The electrodes will at times transmit a tiny electrical current that you may feel as a twinge or spasm. The needle electrode may cause discomfort or pain that usually ends shortly after the needle is removed. If you are concerned about discomfort or pain, you may want to talk to the neurologist about taking a short break during the exam.  Instructions. During the needle EMG, the neurologist will assess whether there is any spontaneous electrical activity when the muscle is at rest - activity that isn't present in healthy muscle tissue - and the degree of activity when you slightly contract the muscle.  He or she will give you instructions on resting and contracting a muscle at appropriate times. Depending on what muscles and nerves the neurologist is examining, he or she may ask you to change positions during the exam.  After your EMG You may experience some temporary, minor bruising where the needle electrode was inserted into your muscle. This bruising should fade within several days. If it persists, contact your primary care doctor.     Preventing Falls at [facility] are common, often dreaded events in the lives of older people. Aside from the obvious injuries and even death that may result, fall can cause wide-ranging consequences including loss of independence, mental decline, decreased activity and mobility. Younger people are also at risk of falling, especially those with chronic illnesses and fatigue.  Ways to reduce risk for falling Examine diet and medications. Warm foods and alcohol  dilate blood vessels, which can lead to dizziness when standing. Sleep aids, antidepressants and pain medications can also increase the likelihood of a fall.  Get a vision exam. Poor vision, cataracts and glaucoma increase the chances of falling.  Check foot gear. Shoes should fit snugly and have a sturdy, nonskid sole  and a broad, low heel  Participate in a physician-approved exercise program to build and maintain muscle strength and improve balance and coordination. Programs that use ankle weights or stretch bands are excellent for muscle-strengthening. Water aerobics programs and low-impact Tai Chi programs have also been shown to improve balance and coordination.  Increase vitamin D intake. Vitamin D improves muscle strength and increases the amount of calcium the body is able to absorb and deposit in bones.  How to prevent falls from common hazards Floors - Remove all loose wires, cords, and throw rugs. Minimize clutter. Make sure rugs are anchored and smooth. Keep furniture in its usual place.  Chairs -- Use chairs with straight backs, armrests and firm seats. Add firm cushions to existing pieces to add height.  Bathroom - Install grab bars and non-skid tape in the tub or shower. Use a bathtub transfer bench or a shower chair with a back support Use an elevated toilet seat and/or safety rails to assist standing from a low surface. Do not use towel racks or bathroom tissue holders to help you stand.  Lighting - Make sure halls, stairways, and entrances are well-lit. Install a night light in your bathroom or hallway. Make sure there is a light switch at the top and bottom of the staircase. Turn lights on if you get up in the middle of the night. Make sure lamps or light switches are within reach of the bed if you have to get up during the night.  Kitchen - Install non-skid rubber mats near the sink and stove. Clean spills immediately. Store frequently used utensils, pots, pans between waist and eye level. This helps prevent reaching and bending. Sit when getting things out of lower cupboards.  Living room/ Bedrooms - Place furniture with wide spaces in between, giving enough room to move around. Establish a route through the living room that gives you something to hold onto as you walk.  Stairs - Make sure  treads, rails, and rugs are secure. Install a rail on both sides of the stairs. If stairs are a threat, it might be helpful to arrange most of your activities on the lower level to reduce the number of times you must climb the stairs.  Entrances and doorways - Install metal handles on the walls adjacent to the doorknobs of all doors to make it more secure as you travel through the doorway.  Tips for maintaining balance Keep at least one hand free at all times. Try using a backpack or fanny pack to hold things rather than carrying them in your hands. Never carry objects in both hands when walking as this interferes with keeping your balance.  Attempt to swing both arms from front to back while walking. This might require a conscious effort if Parkinson's disease has diminished your movement. It will, however, help you to maintain balance and posture, and reduce fatigue.  Consciously lift your feet off of the ground when walking. Shuffling and dragging of the feet is a common culprit in losing your balance.  When trying to navigate turns, use a "U" technique of facing forward and making a wide turn, rather than pivoting sharply.  Try to stand  with your feet shoulder-length apart. When your feet are close together for any length of time, you increase your risk of losing your balance and falling.  Do one thing at a time. Don't try to walk and accomplish another task, such as reading or looking around. The decrease in your automatic reflexes complicates motor function, so the less distraction, the better.  Do not wear rubber or gripping soled shoes, they might "catch" on the floor and cause tripping.  Move slowly when changing positions. Use deliberate, concentrated movements and, if needed, use a grab bar or walking aid. Count 15 seconds between each movement. For example, when rising from a seated position, wait 15 seconds after standing to begin walking.  If balance is a continuous problem, you  might want to consider a walking aid such as a cane, walking stick, or walker. Once you've mastered walking with help, you might be ready to try it on your own again.

## 2022-02-14 LAB — IMMUNOFIXATION ELECTROPHORESIS
IgG (Immunoglobin G), Serum: 797 mg/dL (ref 600–1540)
IgM, Serum: 144 mg/dL (ref 50–300)
Immunoglobulin A: 219 mg/dL (ref 70–320)

## 2022-04-08 HISTORY — PX: COLONOSCOPY: SHX174

## 2022-04-15 ENCOUNTER — Encounter: Payer: Self-pay | Admitting: Neurology

## 2022-04-15 ENCOUNTER — Ambulatory Visit (INDEPENDENT_AMBULATORY_CARE_PROVIDER_SITE_OTHER): Payer: Medicare Other | Admitting: Neurology

## 2022-04-15 ENCOUNTER — Other Ambulatory Visit: Payer: Self-pay

## 2022-04-15 ENCOUNTER — Telehealth: Payer: Self-pay

## 2022-04-15 DIAGNOSIS — R209 Unspecified disturbances of skin sensation: Secondary | ICD-10-CM

## 2022-04-15 DIAGNOSIS — M79605 Pain in left leg: Secondary | ICD-10-CM

## 2022-04-15 DIAGNOSIS — M5416 Radiculopathy, lumbar region: Secondary | ICD-10-CM

## 2022-04-15 DIAGNOSIS — G629 Polyneuropathy, unspecified: Secondary | ICD-10-CM

## 2022-04-15 DIAGNOSIS — R2681 Unsteadiness on feet: Secondary | ICD-10-CM

## 2022-04-15 DIAGNOSIS — M5417 Radiculopathy, lumbosacral region: Secondary | ICD-10-CM

## 2022-04-15 NOTE — Telephone Encounter (Signed)
-----   Message from Shellia Carwin, MD sent at 04/15/2022  3:31 PM EST ----- Regarding: MRI lumbar spine and physical therapy orders Judge Duque,  Could you order an MRI lumbar spine wo contrast for lumbar radiculopathy? Can you also order physical therapy for lumbar radiculopathy and leg pain?  Thank you.  Kai Levins, MD

## 2022-04-15 NOTE — Procedures (Addendum)
[facility]  [place], [place]  [place], [place] [address] Tel: [phone] Fax: [phone] Test Date:  04/15/2022  Patient: April Terry DOB: March 06, 1941 Physician: Kai Levins, MD  Sex: Female Height: '5\' 3"'$  Ref Phys: Kai Levins, MD  ID#: [id number]   Technician:    History: This is an 82 year old female with tingling in her feet.  NCV & EMG Findings: Extensive electrodiagnostic evaluation of the right lower limb with additional needle examination of the left lower limb shows: Right sural and superficial peroneal/fibular sensory responses are within normal limits. Right peroneal/fibular (EDB) and tibial (AH) motor responses are within normal limits. Right H reflex is absent. Chronic motor axon loss changes without accompanying active denervation changes are seen in the right tibialis anterior, flexor digitorum longus, gluteus medius, medial head of gastrocnemius, short head of biceps femoris, and L5 lumbosacral paraspinal muscles. The right rectus femoris, left tibialis anterior, and left medial head of the gastrocnemius muscle are within normal limits with normal motor unit configuration and recruitment patterns.  Impression: This is an abnormal electrodiagnostic evaluation. 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 No electrodiagnostic evidence of a large fiber sensorimotor polyneuropathy.    ___________________________ Kai Levins, MD    Nerve Conduction Studies Motor Nerve Results    Latency Amplitude F-Lat Segment Distance CV Comment  Site (ms) Norm (mV) Norm (ms)  (cm) (m/s) Norm   Right Fibular (EDB) Motor  Ankle 4.3  < 6.0 3.3  > 2.5        Bel fib head 10.9 - 3.2 -  Bel fib head-Ankle 30 45  > 40   Pop fossa 12.8 - 3.0 -  Pop fossa-Bel fib head 8.5 45 -   Right Tibial (AH)  Motor  Ankle 4.5  < 6.0 9.2  > 4.0        Knee 12.1 - 7.1 -  Knee-Ankle 37.5 49  > 40    Sensory Nerve Results   Stim Site NR Peak (ms) Norm Peak (ms) O-P Amp (V) Norm O-P Amp Site1 Site2 Delta-0 (ms) Dist (cm) Vel (m/s) Norm Vel (m/s)  Right Sup Fibular Anti Sensory (Ant Lat Mall)  32.3 C  14 cm    2.8 <4.4 9.3 >6.0 14 cm Ant Lat Mall 2.1 14.0 67 >40  Right Sural Anti Sensory (Lat Mall)  32.3 C  Calf    3.7 <4.4 12.8 >6.0 Calf Lat Mall 3.0 14.0 47 >40    H-Reflex Results    M-Lat H Lat H Neg Amp H-M Lat  Site (ms) (ms) Norm (mV) (ms)  Right Tibial H-Reflex  Pop fossa 5.8 --  < 35.0 -- --   Electromyography   Side Muscle Ins.Act Fibs Fasc Recrt Amp Dur Poly Activation Comment  Right Tib ant Nml Nml Nml *1- *1+ *1+ *1+ Nml N/A  Right Gastroc MH Nml Nml Nml *1- *1+ *1+ *1+ Nml N/A  Right FDL Nml Nml Nml *1- *1+ *1+ *1+ Nml N/A  Right Rectus fem Nml Nml Nml Nml Nml Nml Nml Nml N/A  Right Biceps fem SH Nml Nml Nml *1- *1+ *1+ Nml Nml N/A  Right Gluteus med Nml Nml Nml *2- *1+ *1+ *1+ Nml N/A  Right Lumbar PSP (L5) Nml Nml Nml *1- *1+ *1+ Nml Nml N/A  Left Tib ant Nml Nml Nml Nml  Nml Nml Nml Nml N/A  Left Gastroc MH Nml Nml Nml Nml Nml Nml Nml Nml N/A      Waveforms:     Motor      H-Reflex

## 2022-04-25 ENCOUNTER — Ambulatory Visit: Payer: Medicare Other | Admitting: Cardiology

## 2022-05-13 ENCOUNTER — Ambulatory Visit
Admission: RE | Admit: 2022-05-13 | Discharge: 2022-05-13 | Disposition: A | Discharge status: Home / Self Care | Payer: Medicare Other | Source: Ambulatory Visit | Attending: Neurology | Admitting: Neurology

## 2022-05-13 DIAGNOSIS — M5416 Radiculopathy, lumbar region: Secondary | ICD-10-CM

## 2022-05-13 DIAGNOSIS — R200 Anesthesia of skin: Secondary | ICD-10-CM | POA: Diagnosis not present

## 2022-05-13 DIAGNOSIS — M545 Low back pain, unspecified: Secondary | ICD-10-CM | POA: Diagnosis not present

## 2022-05-13 DIAGNOSIS — M79604 Pain in right leg: Secondary | ICD-10-CM

## 2022-05-31 ENCOUNTER — Ambulatory Visit
Admission: RE | Admit: 2022-05-31 | Discharge: 2022-05-31 | Disposition: A | Discharge status: Home / Self Care | Payer: Medicare Other | Source: Ambulatory Visit | Attending: Internal Medicine | Admitting: Internal Medicine

## 2022-05-31 DIAGNOSIS — Z780 Asymptomatic menopausal state: Secondary | ICD-10-CM | POA: Diagnosis not present

## 2022-05-31 DIAGNOSIS — M8589 Other specified disorders of bone density and structure, multiple sites: Secondary | ICD-10-CM | POA: Diagnosis not present

## 2022-05-31 DIAGNOSIS — M85859 Other specified disorders of bone density and structure, unspecified thigh: Secondary | ICD-10-CM

## 2022-06-04 DIAGNOSIS — M069 Rheumatoid arthritis, unspecified: Secondary | ICD-10-CM | POA: Diagnosis not present

## 2022-06-04 DIAGNOSIS — E559 Vitamin D deficiency, unspecified: Secondary | ICD-10-CM | POA: Diagnosis not present

## 2022-06-04 DIAGNOSIS — I10 Essential (primary) hypertension: Secondary | ICD-10-CM | POA: Diagnosis not present

## 2022-06-04 DIAGNOSIS — F3341 Major depressive disorder, recurrent, in partial remission: Secondary | ICD-10-CM | POA: Diagnosis not present

## 2022-06-04 DIAGNOSIS — E063 Autoimmune thyroiditis: Secondary | ICD-10-CM | POA: Diagnosis not present

## 2022-06-04 DIAGNOSIS — E7800 Pure hypercholesterolemia, unspecified: Secondary | ICD-10-CM | POA: Diagnosis not present

## 2022-06-19 DIAGNOSIS — K648 Other hemorrhoids: Secondary | ICD-10-CM | POA: Diagnosis not present

## 2022-06-19 DIAGNOSIS — D120 Benign neoplasm of cecum: Secondary | ICD-10-CM | POA: Diagnosis not present

## 2022-06-19 DIAGNOSIS — K6289 Other specified diseases of anus and rectum: Secondary | ICD-10-CM | POA: Diagnosis not present

## 2022-06-19 DIAGNOSIS — D122 Benign neoplasm of ascending colon: Secondary | ICD-10-CM | POA: Diagnosis not present

## 2022-06-19 DIAGNOSIS — R1084 Generalized abdominal pain: Secondary | ICD-10-CM | POA: Diagnosis not present

## 2022-06-21 DIAGNOSIS — D122 Benign neoplasm of ascending colon: Secondary | ICD-10-CM | POA: Diagnosis not present

## 2022-06-21 DIAGNOSIS — D120 Benign neoplasm of cecum: Secondary | ICD-10-CM | POA: Diagnosis not present

## 2022-07-01 DIAGNOSIS — D122 Benign neoplasm of ascending colon: Secondary | ICD-10-CM | POA: Diagnosis not present

## 2022-07-01 DIAGNOSIS — K6289 Other specified diseases of anus and rectum: Secondary | ICD-10-CM | POA: Diagnosis not present

## 2022-07-01 DIAGNOSIS — D120 Benign neoplasm of cecum: Secondary | ICD-10-CM | POA: Diagnosis not present

## 2022-07-01 DIAGNOSIS — K648 Other hemorrhoids: Secondary | ICD-10-CM | POA: Diagnosis not present

## 2022-07-01 DIAGNOSIS — R1084 Generalized abdominal pain: Secondary | ICD-10-CM | POA: Diagnosis not present

## 2022-07-06 IMAGING — CT CT ABD-PELV W/ CM
2 of 5 series · 11 of 46 positions shown, 12 images · IV contrast (iopamidol)
Comparison: None aside from remote coronary calcium scoring.

CLINICAL DATA: A 80-year-old female presents with pelvic pressure
and loose stools with loss of appetite for 2 months. No reported
history of neoplasm.

EXAM:
CT ABDOMEN AND PELVIS WITH CONTRAST
TECHNIQUE: Multidetector CT imaging of the abdomen and pelvis was performed
using the standard protocol following bolus administration of
intravenous contrast.
CONTRAST:  100mL T7ABMI-RKK IOPAMIDOL (T7ABMI-RKK) INJECTION 61%

[Series 2: abd pelvis 5.00 br40 s3 axial · axial · 0.58mm/px · z∈[+1267,+1627]mm · 8 of 84 slices shown, 9 images]
[im 6/84  soft-tissue]
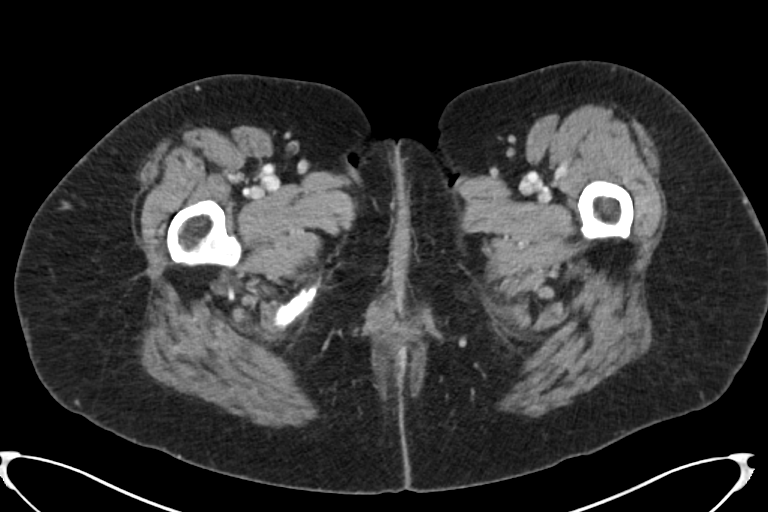
[im 6/84  bone]
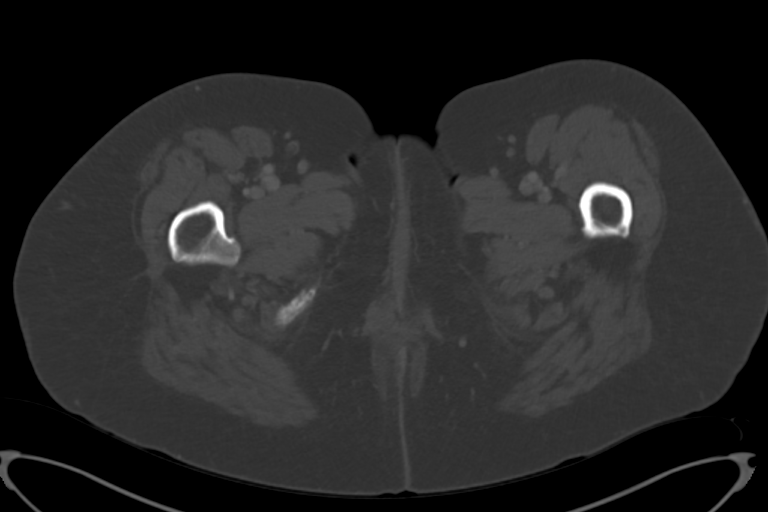
[im 16/84  soft-tissue]
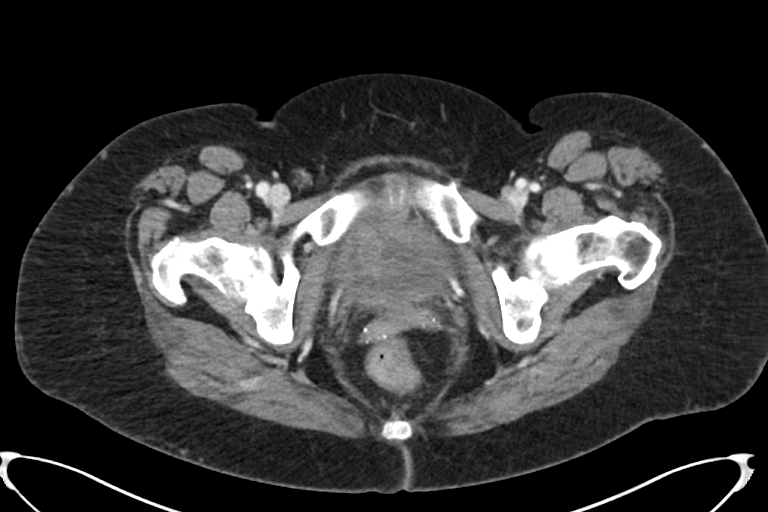
[im 26/84  soft-tissue]
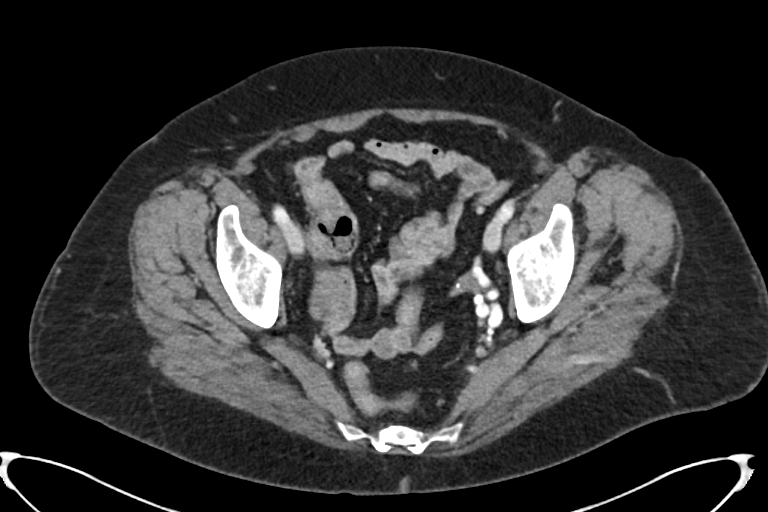
[im 37/84  soft-tissue]
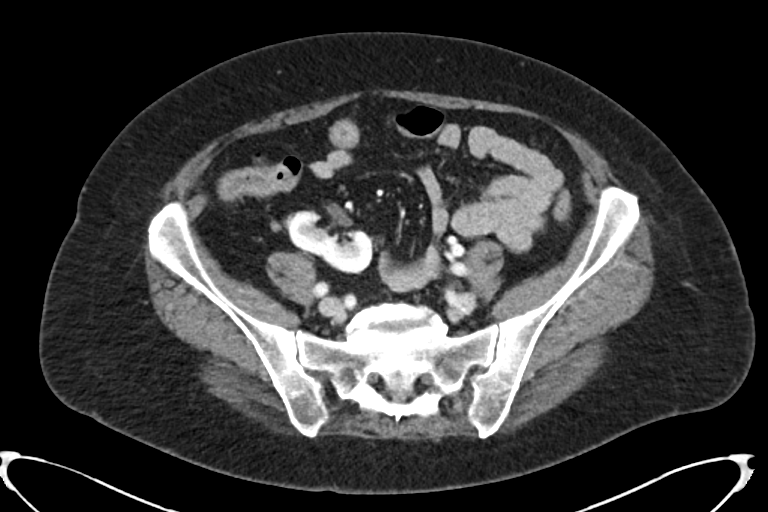
[im 47/84  soft-tissue]
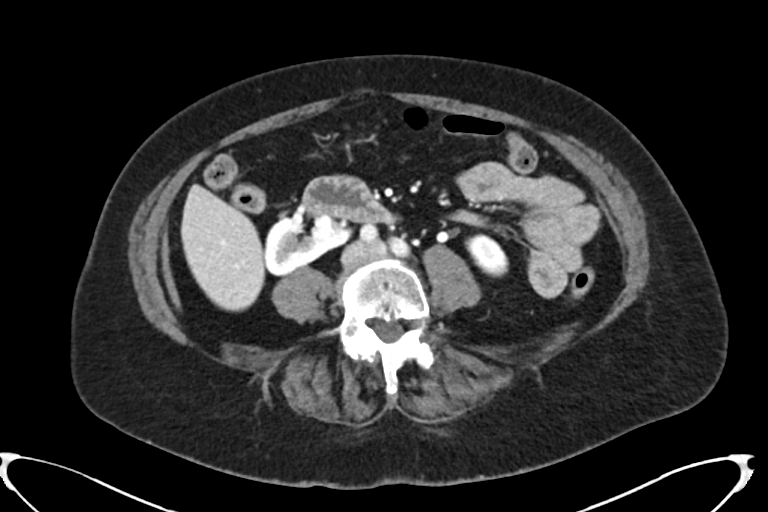
[im 58/84  soft-tissue]
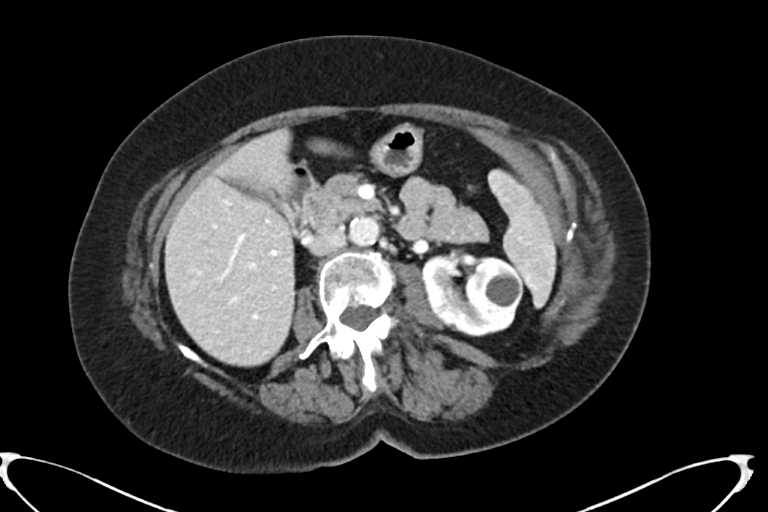
[im 68/84  soft-tissue]
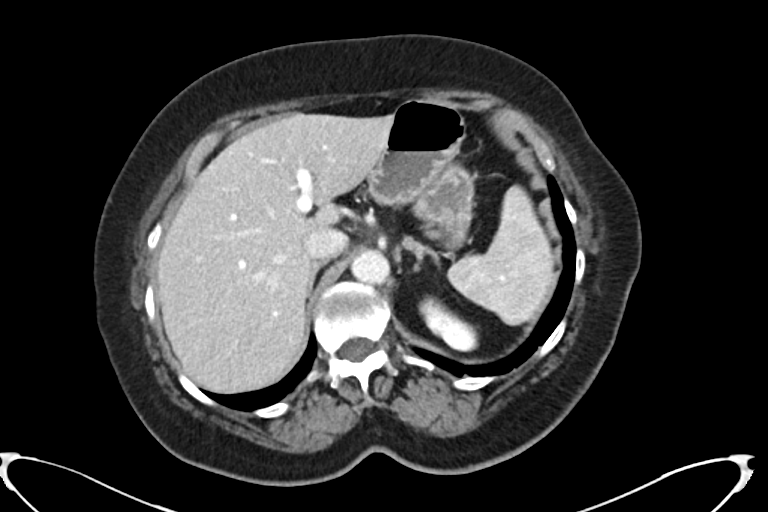
[im 78/84  soft-tissue]
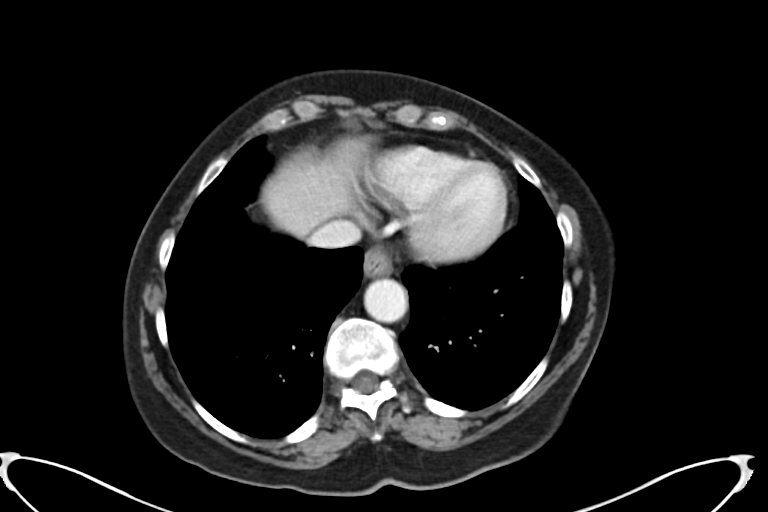

[Series 6: abd pelvis 2.00 br40 s3 cor · coronal · 0.83mm/px · 3 of 138 slices shown]
[im 46/138  soft-tissue]
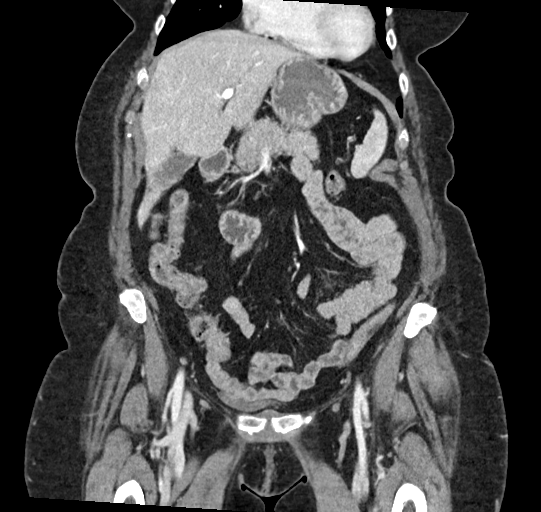
[im 61/138  soft-tissue]
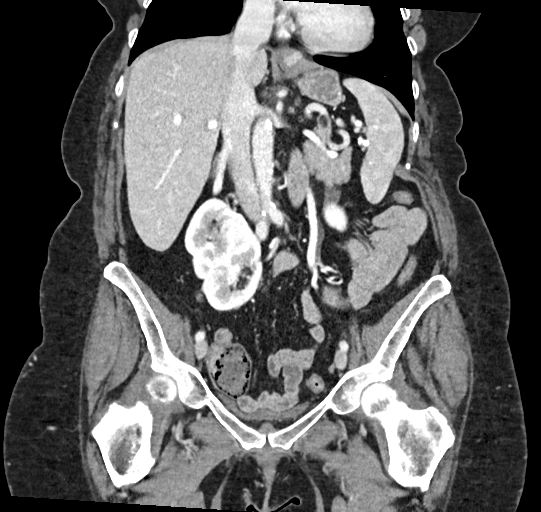
[im 77/138  soft-tissue]
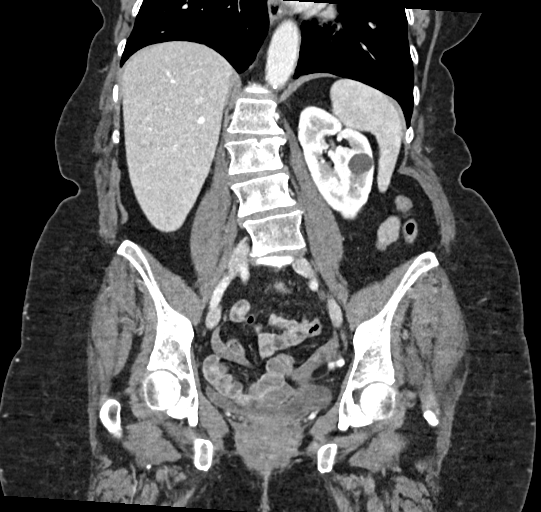

[11 of 46 positions shown; findings below may reference images not displayed]

FINDINGS: Lower chest: Lung bases are clear. No effusion. No consolidative
changes.

Hepatobiliary: Small low-density lesion cephalad RIGHT hemi liver
less than a cm likely a small cyst. Smooth hepatic contours. Portal
vein is patent. No pericholecystic stranding. No biliary duct
dilation.

Pancreas: Normal, without mass, inflammation or ductal dilatation.

Spleen: Spleen normal size and contour.

Adrenals/Urinary Tract: Adrenal glands are normal with malrotated
bilateral kidneys with ectopic RIGHT kidney both malrotated and
located in the lower abdomen extending into upper pelvis.

Urinary bladder is collapsed without signs of adjacent stranding.

No perinephric stranding.  No hydronephrosis.  No visible calculi.

Low-density renal lesions on the LEFT the largest of which is
clearly a cyst visually other lesions with similar density also
likely cysts. Largest in the interpolar aspect (image [DATE]) 1.7 cm a
12 Hounsfield units.

Stomach/Bowel: Small hiatal hernia. Mild circumferential distal
esophageal thickening. No small bowel dilation. No perienteric
stranding. Appendix surgically absent by report. No pericecal
stranding. Colon is largely collapsed without signs of adjacent
inflammation.

Vascular/Lymphatic: Peripherally calcified splenic artery aneurysm
measures 1.5 x 1.1 cm in the coronal plane and 1.1 cm greatest
anterior-posterior dimension in the axial plane.

Aortic atherosclerosis with scattered calcified and noncalcified
atheromatous plaque. There is no gastrohepatic or hepatoduodenal
ligament lymphadenopathy. No retroperitoneal or mesenteric
lymphadenopathy.

No pelvic sidewall lymphadenopathy.

Reproductive: Post hysterectomy without adnexal mass.

Other: No ascites.  No free air.

Musculoskeletal: No acute bone finding. No destructive bone process.
Spinal degenerative changes.
IMPRESSION: No acute intra-abdominal findings.

Mild circumferential distal esophageal thickening may mild represent
esophagitis. Small hiatal hernia. Correlate with any symptoms that
would suggest esophagitis.

Peripherally calcified splenic artery aneurysm measures 1.5 x 1.1 cm
in the coronal plane and 1.1 cm greatest anterior-posterior
dimension in the axial plane. Consider follow-up [HOSPITAL] 1 year
or comparison with prior studies to document stability.

Malrotated bilateral kidneys with renal ectopia on the RIGHT, no
hydronephrosis or perinephric stranding.

Aortic Atherosclerosis (2[facility].C).

## 2022-07-15 DIAGNOSIS — Z6826 Body mass index (BMI) 26.0-26.9, adult: Secondary | ICD-10-CM | POA: Diagnosis not present

## 2022-07-15 DIAGNOSIS — M1991 Primary osteoarthritis, unspecified site: Secondary | ICD-10-CM | POA: Diagnosis not present

## 2022-07-15 DIAGNOSIS — Z79899 Other long term (current) drug therapy: Secondary | ICD-10-CM | POA: Diagnosis not present

## 2022-07-15 DIAGNOSIS — M353 Polymyalgia rheumatica: Secondary | ICD-10-CM | POA: Diagnosis not present

## 2022-07-15 DIAGNOSIS — M0589 Other rheumatoid arthritis with rheumatoid factor of multiple sites: Secondary | ICD-10-CM | POA: Diagnosis not present

## 2022-07-15 DIAGNOSIS — G629 Polyneuropathy, unspecified: Secondary | ICD-10-CM | POA: Diagnosis not present

## 2022-07-15 DIAGNOSIS — M25561 Pain in right knee: Secondary | ICD-10-CM | POA: Diagnosis not present

## 2022-07-15 DIAGNOSIS — E663 Overweight: Secondary | ICD-10-CM | POA: Diagnosis not present

## 2022-07-29 DIAGNOSIS — H16223 Keratoconjunctivitis sicca, not specified as Sjogren's, bilateral: Secondary | ICD-10-CM | POA: Diagnosis not present

## 2022-08-27 DIAGNOSIS — M25561 Pain in right knee: Secondary | ICD-10-CM | POA: Diagnosis not present

## 2022-08-27 DIAGNOSIS — E663 Overweight: Secondary | ICD-10-CM | POA: Diagnosis not present

## 2022-08-27 DIAGNOSIS — M0589 Other rheumatoid arthritis with rheumatoid factor of multiple sites: Secondary | ICD-10-CM | POA: Diagnosis not present

## 2022-08-27 DIAGNOSIS — M353 Polymyalgia rheumatica: Secondary | ICD-10-CM | POA: Diagnosis not present

## 2022-08-27 DIAGNOSIS — Z79899 Other long term (current) drug therapy: Secondary | ICD-10-CM | POA: Diagnosis not present

## 2022-08-27 DIAGNOSIS — M1991 Primary osteoarthritis, unspecified site: Secondary | ICD-10-CM | POA: Diagnosis not present

## 2022-08-27 DIAGNOSIS — G629 Polyneuropathy, unspecified: Secondary | ICD-10-CM | POA: Diagnosis not present

## 2022-08-27 DIAGNOSIS — Z6826 Body mass index (BMI) 26.0-26.9, adult: Secondary | ICD-10-CM | POA: Diagnosis not present

## 2022-11-18 DIAGNOSIS — H31002 Unspecified chorioretinal scars, left eye: Secondary | ICD-10-CM | POA: Diagnosis not present

## 2022-11-18 DIAGNOSIS — H2513 Age-related nuclear cataract, bilateral: Secondary | ICD-10-CM | POA: Diagnosis not present

## 2022-11-18 DIAGNOSIS — H52203 Unspecified astigmatism, bilateral: Secondary | ICD-10-CM | POA: Diagnosis not present

## 2022-11-18 DIAGNOSIS — H5203 Hypermetropia, bilateral: Secondary | ICD-10-CM | POA: Diagnosis not present

## 2022-11-22 DIAGNOSIS — M85859 Other specified disorders of bone density and structure, unspecified thigh: Secondary | ICD-10-CM | POA: Diagnosis not present

## 2022-11-22 DIAGNOSIS — E063 Autoimmune thyroiditis: Secondary | ICD-10-CM | POA: Diagnosis not present

## 2022-11-22 DIAGNOSIS — M069 Rheumatoid arthritis, unspecified: Secondary | ICD-10-CM | POA: Diagnosis not present

## 2022-11-22 DIAGNOSIS — Z1331 Encounter for screening for depression: Secondary | ICD-10-CM | POA: Diagnosis not present

## 2022-11-22 DIAGNOSIS — Z0000 Encounter for general adult medical examination without abnormal findings: Secondary | ICD-10-CM | POA: Diagnosis not present

## 2022-11-22 DIAGNOSIS — E559 Vitamin D deficiency, unspecified: Secondary | ICD-10-CM | POA: Diagnosis not present

## 2022-11-22 DIAGNOSIS — F3341 Major depressive disorder, recurrent, in partial remission: Secondary | ICD-10-CM | POA: Diagnosis not present

## 2022-11-22 DIAGNOSIS — E7800 Pure hypercholesterolemia, unspecified: Secondary | ICD-10-CM | POA: Diagnosis not present

## 2022-11-22 DIAGNOSIS — I10 Essential (primary) hypertension: Secondary | ICD-10-CM | POA: Diagnosis not present

## 2022-11-22 LAB — CBC: RBC: 3.98 (ref 3.87–5.11)

## 2022-11-22 LAB — CBC AND DIFFERENTIAL
HCT: 41 (ref 36–46)
Hemoglobin: 13.9 (ref 12.0–16.0)
Platelets: 226 10*3/uL (ref 150–400)
WBC: 6.1

## 2022-11-22 LAB — LIPID PANEL
Cholesterol: 147 (ref 0–200)
HDL: 51 (ref 35–70)
LDL Cholesterol: 79
Triglycerides: 96 (ref 40–160)

## 2022-11-22 LAB — COMPREHENSIVE METABOLIC PANEL
Albumin: 4.6 (ref 3.5–5.0)
Calcium: 9.8 (ref 8.7–10.7)
eGFR: 73

## 2022-11-22 LAB — TSH: TSH: 3.47 (ref 0.41–5.90)

## 2022-11-22 LAB — BASIC METABOLIC PANEL
BUN: 20 (ref 4–21)
CO2: 33 — AB (ref 13–22)
Chloride: 103 (ref 99–108)
Creatinine: 0.8 (ref 0.5–1.1)
Glucose: 100
Potassium: 4.5 meq/L (ref 3.5–5.1)
Sodium: 140 (ref 137–147)

## 2022-11-22 LAB — HEPATIC FUNCTION PANEL
ALT: 30 U/L (ref 7–35)
AST: 26 (ref 13–35)
Bilirubin, Total: 1

## 2022-12-03 ENCOUNTER — Other Ambulatory Visit: Payer: Self-pay | Admitting: Internal Medicine

## 2022-12-03 DIAGNOSIS — Z1231 Encounter for screening mammogram for malignant neoplasm of breast: Secondary | ICD-10-CM

## 2022-12-16 DIAGNOSIS — M0589 Other rheumatoid arthritis with rheumatoid factor of multiple sites: Secondary | ICD-10-CM | POA: Diagnosis not present

## 2022-12-16 DIAGNOSIS — M1991 Primary osteoarthritis, unspecified site: Secondary | ICD-10-CM | POA: Diagnosis not present

## 2022-12-16 DIAGNOSIS — M25561 Pain in right knee: Secondary | ICD-10-CM | POA: Diagnosis not present

## 2022-12-16 DIAGNOSIS — M353 Polymyalgia rheumatica: Secondary | ICD-10-CM | POA: Diagnosis not present

## 2022-12-16 DIAGNOSIS — G629 Polyneuropathy, unspecified: Secondary | ICD-10-CM | POA: Diagnosis not present

## 2022-12-16 DIAGNOSIS — Z79899 Other long term (current) drug therapy: Secondary | ICD-10-CM | POA: Diagnosis not present

## 2022-12-16 DIAGNOSIS — Z6826 Body mass index (BMI) 26.0-26.9, adult: Secondary | ICD-10-CM | POA: Diagnosis not present

## 2022-12-16 DIAGNOSIS — E663 Overweight: Secondary | ICD-10-CM | POA: Diagnosis not present

## 2023-01-13 ENCOUNTER — Ambulatory Visit: Payer: Medicare Other | Admitting: Nurse Practitioner

## 2023-01-13 ENCOUNTER — Encounter: Payer: Self-pay | Admitting: Nurse Practitioner

## 2023-01-13 VITALS — BP 126/80 | HR 83 | Temp 97.9°F | Ht 63.75 in | Wt 153.0 lb

## 2023-01-13 DIAGNOSIS — M150 Primary generalized (osteo)arthritis: Secondary | ICD-10-CM | POA: Diagnosis not present

## 2023-01-13 DIAGNOSIS — E785 Hyperlipidemia, unspecified: Secondary | ICD-10-CM | POA: Diagnosis not present

## 2023-01-13 DIAGNOSIS — M069 Rheumatoid arthritis, unspecified: Secondary | ICD-10-CM | POA: Diagnosis not present

## 2023-01-13 DIAGNOSIS — Z23 Encounter for immunization: Secondary | ICD-10-CM | POA: Diagnosis not present

## 2023-01-13 DIAGNOSIS — I10 Essential (primary) hypertension: Secondary | ICD-10-CM | POA: Diagnosis not present

## 2023-01-13 DIAGNOSIS — G629 Polyneuropathy, unspecified: Secondary | ICD-10-CM | POA: Diagnosis not present

## 2023-01-13 DIAGNOSIS — M8580 Other specified disorders of bone density and structure, unspecified site: Secondary | ICD-10-CM

## 2023-01-13 NOTE — Progress Notes (Signed)
Careteam: Patient Care Team: Sharon Seller, NP as PCP - General (Geriatric Medicine) Marykay Lex, MD as Consulting Physician (Cardiology)  PLACE OF SERVICE:  [facility]  Advanced Directive information Does Patient Have a Medical Advance Directive?: Yes, Type of Advance Directive: Out of facility DNR (pink MOST or yellow form);Healthcare Power of [place];Living will, Pre-existing out of facility DNR order (yellow form or pink MOST form): Pink MOST form placed in chart (order not valid for inpatient use), Does patient want to make changes to medical advance directive?: No - Patient declined  No Known Allergies  Chief Complaint  Patient presents with   Establish Care    New patient to establish care. Pill bottles present at initial appointment.      HPI: Patient is a 82 y.o. female to establish care.  Her last AWV was 2 months ago.   Hyperlipidemia- has blood work 2 months ago- on atorvastatin   Recent bone density- osteopenia- on calcium and vit d  Htn- on irbesartan-hydrochlorothiazide   Osteoarthritis- getting cortisone shot every 4 months, sees Rheumatologist  Also uses meloxicam 15 mg PRN (rarely uses)  RA- on sulfasalazine which controls symptoms.   Has lost about 20 lbs in the last few months and pain in knee much better.   Anxious- controlled on zoloft 50 mg daily   Neuropathy- saw neurologist and had nerve conduction study, could not determine cause. Foot is constantly asleep.    Review of Systems:  Review of Systems  Constitutional:  Negative for chills, fever and weight loss.  HENT:  Negative for tinnitus.   Respiratory:  Negative for cough, sputum production and shortness of breath.   Cardiovascular:  Negative for chest pain, palpitations and leg swelling.  Gastrointestinal:  Negative for abdominal pain, constipation, diarrhea and heartburn.  Genitourinary:  Negative for dysuria, frequency and urgency.  Musculoskeletal:  Negative for back pain,  falls, joint pain and myalgias.  Skin: Negative.   Neurological:  Negative for dizziness and headaches.  Psychiatric/Behavioral:  Negative for depression and memory loss. The patient does not have insomnia.     Past Medical History:  Diagnosis Date   Essential hypertension    H/O polymyalgia rheumatica    Was symptoms have mostly resolved after prolonged treatment with steroids   Hyperlipidemia    Well-controlled on fish oil and low-dose Lipitor.   Rheumatoid arthritis involving both hands (HCC)    Situational mixed anxiety and depressive disorder    Past Surgical History:  Procedure Laterality Date   ABDOMINAL HYSTERECTOMY  1984   APPENDECTOMY  1950   TRANSTHORACIC ECHOCARDIOGRAM  02/2012   Mild concentric hypertrophy.  EF 55-60%.  No regional wall motion normality.  No mitral prolapse noted.   Social History:   reports that she has never smoked. She has never used smokeless tobacco. She reports current alcohol use of about 2.0 standard drinks of alcohol per week. She reports that she does not use drugs.  Family History  Problem Relation Age of Onset   Heart failure Mother    Stroke Father    Heart failure Sister    Arthritis Brother    Heart disease Brother    Obesity Brother    Thyroid cancer Daughter        10-12 years ago as of 2024   Depression Son    Anxiety disorder Son    Alcoholism Son        Recovering    Medications: Patient's Medications  New Prescriptions  No medications on file  Previous Medications   ATORVASTATIN (LIPITOR) 10 MG TABLET    Take 10 mg by mouth daily.   CALCIUM CARB-CHOLECALCIFEROL 1000-800 MG-UNIT TABS    Take 1 tablet by mouth daily.   CHOLECALCIFEROL 25 MCG (1000 UT) CAPSULE    Take 1,000 Units by mouth daily.   IRBESARTAN-HYDROCHLOROTHIAZIDE (AVALIDE) 150-12.5 MG TABLET    TK 1 T PO QD IN THE EVE   MELOXICAM (MOBIC) 15 MG TABLET    Take 15 mg by mouth as needed.   MULTIVITAMIN-LUTEIN (OCUVITE-LUTEIN) CAPS CAPSULE    Take 1 capsule  by mouth daily.   SERTRALINE (ZOLOFT) 100 MG TABLET    Take 50 mg by mouth daily.   SULFASALAZINE (AZULFIDINE) 500 MG EC TABLET    Take 1,000 mg by mouth 2 (two) times daily.   TURMERIC (QC TUMERIC COMPLEX PO)    Take 1,000 mg by mouth daily.  Modified Medications   No medications on file  Discontinued Medications   COVID-19 MRNA BIVALENT VACCINE, PFIZER, (PFIZER COVID-19 VAC BIVALENT) INJECTION    Inject into the muscle.   COVID-19 MRNA VACCINE 2023-2024 (COMIRNATY) SUSP INJECTION    Inject into the muscle.   CYCLOSPORINE (RESTASIS) 0.05 % OPHTHALMIC EMULSION    1 drop 2 (two) times daily.   UNABLE TO FIND    Med Name: ocuivite po take one tablet a day    Physical Exam:  Vitals:   01/13/23 1309  BP: 126/80  Pulse: 83  Temp: 97.9 F (36.6 C)  TempSrc: Temporal  SpO2: 97%  Weight: 153 lb (69.4 kg)  Height: 5' 3.75" (1.619 m)   Body mass index is 26.47 kg/m. Wt Readings from Last 3 Encounters:  01/13/23 153 lb (69.4 kg)  02/08/22 155 lb (70.3 kg)  06/26/21 158 lb (71.7 kg)    Physical Exam Constitutional:      General: She is not in acute distress.    Appearance: She is well-developed. She is not diaphoretic.  HENT:     Head: Normocephalic and atraumatic.     Mouth/Throat:     Pharynx: No oropharyngeal exudate.  Eyes:     Conjunctiva/sclera: Conjunctivae normal.     Pupils: Pupils are equal, round, and reactive to light.  Cardiovascular:     Rate and Rhythm: Normal rate and regular rhythm.     Heart sounds: Normal heart sounds.  Pulmonary:     Effort: Pulmonary effort is normal.     Breath sounds: Normal breath sounds.  Abdominal:     General: Bowel sounds are normal.     Palpations: Abdomen is soft.  Musculoskeletal:     Cervical back: Normal range of motion and neck supple.     Right lower leg: No edema.     Left lower leg: No edema.  Skin:    General: Skin is warm and dry.  Neurological:     Mental Status: She is alert.  Psychiatric:        Mood and  Affect: Mood normal.     Labs reviewed: Basic Metabolic Panel: No results for input(s): "NA", "K", "CL", "CO2", "GLUCOSE", "BUN", "CREATININE", "CALCIUM", "MG", "PHOS", "TSH" in the last 8760 hours. Liver Function Tests: No results for input(s): "AST", "ALT", "ALKPHOS", "BILITOT", "PROT", "ALBUMIN" in the last 8760 hours. No results for input(s): "LIPASE", "AMYLASE" in the last 8760 hours. No results for input(s): "AMMONIA" in the last 8760 hours. CBC: No results for input(s): "WBC", "NEUTROABS", "HGB", "HCT", "MCV", "PLT" in the  last 8760 hours. Lipid Panel: No results for input(s): "CHOL", "HDL", "LDLCALC", "TRIG", "CHOLHDL", "LDLDIRECT" in the last 8760 hours. TSH: No results for input(s): "TSH" in the last 8760 hours. A1C: No results found for: "HGBA1C"   Assessment/Plan 1. Osteopenia, unspecified location Continue to take calcium 600 mg twice daily with Vitamin D 2000 units daily and weight bearing activity 30 mins/5 days a week  2. Need for influenza vaccination - Flu Vaccine Trivalent High Dose (Fluad)  3. Neuropathy Stable, continue exercises  4. Hyperlipidemia with target LDL less than 100 Continues on lipitor, will get blood work from prior PCP  5. Primary hypertension -Blood pressure well controlled, goal bp <140/90 Continue current medications and dietary modifications follow metabolic panel  6. Primary osteoarthritis involving multiple joints Ongoing, continue exercises and uses mobic PRN  7. Rheumatoid arthritis involving multiple sites, unspecified whether rheumatoid factor present (HCC) Followed by rheumatologist, continues on sulfasalazine  Return in about 6 months (around 07/14/2023) for routine follow up .  Janene Harvey. Biagio Borg [facility] [phone]

## 2023-01-17 ENCOUNTER — Encounter: Payer: Self-pay | Admitting: Nurse Practitioner

## 2023-01-22 ENCOUNTER — Ambulatory Visit
Admission: RE | Admit: 2023-01-22 | Discharge: 2023-01-22 | Disposition: A | Discharge status: Home / Self Care | Payer: Medicare Other | Source: Ambulatory Visit | Attending: Internal Medicine | Admitting: Internal Medicine

## 2023-01-22 DIAGNOSIS — Z1231 Encounter for screening mammogram for malignant neoplasm of breast: Secondary | ICD-10-CM

## 2023-02-06 NOTE — Progress Notes (Deleted)
I saw April Terry in neurology clinic on 02/14/23 in follow up for tingling in feet and ankles.  HPI: April Terry is a 82 y.o. year old female with a history of right-handed female with a medical history of HTN, HLD, RA (on sulfasalazine for 15 years), PMR, right sided sciatica, vit D deficiency, depression who we last saw on 02/08/22.  To briefly review: Patient has had symptoms for 10 years. She describes tingling in her feet and ankles. She describes feeling like paper is wrapped around her toes and occasional electric shocks. She also does not feel as steady on her feet as previous. Her toes can occasionally be sensitive to touch. Over the years, it has bothered patient more. She thinks it may be a little worse on the right foot compared to the left. She has not had any recent falls. She does not use an assistive device. She saw a neurologist in 2022. There was to be testing but patient did not follow up.  There no labs, imaging, or EMG that I can see.   She has no symptoms in her hands or arms. She does endorse occasional lower back pain but no radiation.   Per patient, symptoms are not currently affecting her lifestyle. She has never been on a medication for her symptoms and her symptoms are not painful enough currently that she would want symptomatic treatment.   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 This has been doing well, particularly with cleaning up her diet.   Patient is on sertraline 50 mg for depression.   The patient has not noticed any recent skin rashes nor does she report any constitutional symptoms like fever, night sweats, anorexia or unintentional weight loss.   EtOH use: 1 glass of wine per month  Restrictive diet? No Family history of neuropathy/myopathy/NM disease? Not known   Patient has not been on any supplements for at least 6 months (was on a multivitamin and vit D previously).  Most recent Assessment and Plan  (02/08/22): April Terry is a 82 y.o. female who presents for evaluation of numbness and tingling in feet. She has a relevant medical history of HTN, HLD, RA, PMR, right sided sciatica, vit D deficiency, depression. Her neurological examination is pertinent for diminished sensation in bilateral lower extremities and in bilateral hands (right greater than left). I have no previous testing (labs or otherwise) to evaluate. 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 There is some asymmetry, particularly in the feet that may be due to prior right sided sciatica or radiculopathy. I will do an EMG to establish the diagnosis of neuropathy and look for overlapping pathology. I will also send labs to look for treatable causes as I do not currently have known risk factors for neuropathy. Patient did not feel her symptoms warranted treatment at this time.   PLAN: -Blood work: B12, IFE. Attempted to order HbA1c but not covered by insurance -EMG: PN protocol (R > L) -Foot care discussed  Since their last visit: Labs were normal. EMG showed residuals of old right L5-S1, but no evidence of a large fiber polyneuropathy. Based on EMG, MRI lumbar spine was completed and did not show any significant stenosis. PT was offered. ***  ***  ROS: Pertinent positive and negative systems reviewed in HPI. ***   MEDICATIONS:  Outpatient Encounter Medications as of 02/14/2023  Medication Sig   atorvastatin (LIPITOR) 10 MG tablet Take 10 mg by mouth daily.   Calcium Carb-Cholecalciferol 1000-800 MG-UNIT  TABS Take 1 tablet by mouth daily.   irbesartan-hydrochlorothiazide (AVALIDE) 150-12.5 MG tablet TK 1 T PO QD IN THE EVE   meloxicam (MOBIC) 15 MG tablet Take 15 mg by mouth as needed.   multivitamin-lutein (OCUVITE-LUTEIN) CAPS capsule Take 1 capsule by mouth daily.   sertraline (ZOLOFT) 50 MG tablet Take 50 mg by mouth daily.   sulfaSALAzine (AZULFIDINE) 500 MG EC tablet Take 1,000 mg by mouth 2 (two) times  daily.   Turmeric (QC TUMERIC COMPLEX PO) Take 1,000 mg by mouth daily.   No facility-administered encounter medications on file as of 02/14/2023.    PAST MEDICAL HISTORY: Past Medical History:  Diagnosis Date   Essential hypertension    Fibroids    Per [facility]   H/O polymyalgia rheumatica    Was symptoms have mostly resolved after prolonged treatment with steroids   Hashimoto's thyroiditis    Per [facility]   Hyperlipidemia    Well-controlled on fish oil and low-dose Lipitor.   Lactose intolerance    Per [facility]   Major depressive disorder    Per [facility]   Osteopenia of hip, unspecified laterality    Per [facility]   Osteopenia of spine    Per [facility]   Polymyalgia rheumatica (HCC)    Per [facility]   Pure hypercholesterolemia    Per [facility]   Rheumatoid arthritis involving both hands (HCC)    Situational mixed anxiety and depressive disorder    Tingling of both feet    Per [facility]   Vitamin D deficiency    Per [facility]    PAST SURGICAL HISTORY: Past Surgical History:  Procedure Laterality Date   ABDOMINAL HYSTERECTOMY  1984   APPENDECTOMY  1950   COLONOSCOPY  2003   Per [facility]   COLONOSCOPY  2013   COLONOSCOPY  2024   Per [facility]   PARTIAL HYSTERECTOMY  1983   Ovaries retained, Per [facility]   TRANSTHORACIC ECHOCARDIOGRAM  02/2012   Mild concentric hypertrophy.  EF 55-60%.  No regional wall motion normality.  No mitral prolapse noted.    ALLERGIES: No Known Allergies  FAMILY HISTORY: Family History  Problem Relation Age of Onset   Obesity Mother    Heart failure Mother    Stroke Father    Heart disease Sister    Heart failure Sister    Arthritis Brother    Heart disease Brother    Colon cancer Brother        Per [facility]   Obesity Brother    Diabetes Brother        Per [facility]   Thyroid cancer Daughter        10-12 years  ago as of 2024   Depression Son    Anxiety disorder Son    Alcoholism Son        Recovering   Heart failure Maternal Aunt        Per [facility]    SOCIAL HISTORY: Social History   Tobacco Use   Smoking status: Never   Smokeless tobacco: Never  Vaping Use   Vaping status: Never Used  Substance Use Topics   Alcohol use: Yes    Alcohol/week: 2.0 standard drinks of alcohol    Types: 2 Glasses of wine per week    Comment: 0-1 weekly   Drug use: No   Social History   Social History Narrative   Married Windy Fast) mother of 2, 3 grandchildren  and 2 great-grandchildren   Are you right handed or left handed? Right   Are you currently employed ? no      Do you live at home alone? Live with husband of 61 years    Caffeine daily 2 cup   What type of home do you live in: 1 story or 2 story? one        Objective:  Vital Signs:  There were no vitals taken for this visit.  General:*** General appearance: Awake and alert. No distress. Cooperative with exam.  Skin: No obvious rash or jaundice. HEENT: Atraumatic. Anicteric. Lungs: Non-labored breathing on room air  Heart: Regular Abdomen: Soft, non tender. Extremities: No edema. No obvious deformity.  Musculoskeletal: No obvious joint swelling.  Neurological: Mental Status: Alert. Speech fluent. No pseudobulbar affect Cranial Nerves: CNII: No RAPD. Visual fields intact. CNIII, IV, VI: PERRL. No nystagmus. EOMI. CN V: Facial sensation intact bilaterally to fine touch. [place] clench strong. Jaw jerk***. CN VII: Facial muscles symmetric and strong. No ptosis at rest or after sustained upgaze***. CN VIII: Hears finger rub well bilaterally. CN IX: No hypophonia. CN X: Palate elevates symmetrically. CN XI: Full strength shoulder shrug bilaterally. CN XII: Tongue protrusion full and midline. No atrophy or fasciculations. No significant dysarthria*** Motor: Tone is ***. *** fasciculations in *** extremities. ***  atrophy. No grip or percussive myotonia.  Individual muscle group testing (MRC grade out of 5):  Movement     Neck flexion ***    Neck extension ***     Right Left   Shoulder abduction *** ***   Shoulder adduction *** ***   Shoulder ext rotation *** ***   Shoulder int rotation *** ***   Elbow flexion *** ***   Elbow extension *** ***   Wrist extension *** ***   Wrist flexion *** ***   Finger abduction - FDI *** ***   Finger abduction - ADM *** ***   Finger extension *** ***   Finger distal flexion - 2/3 *** ***   Finger distal flexion - 4/5 *** ***   Thumb flexion - FPL *** ***   Thumb abduction - APB *** ***    Hip flexion *** ***   Hip extension *** ***   Hip adduction *** ***   Hip abduction *** ***   Knee extension *** ***   Knee flexion *** ***   Dorsiflexion *** ***   Plantarflexion *** ***   Inversion *** ***   Eversion *** ***   Great toe extension *** ***   Great toe flexion *** ***     Reflexes:  Right Left  Bicep *** ***  Tricep *** ***  BrRad *** ***  Knee *** ***  Ankle *** ***   Pathological Reflexes: Babinski: *** response bilaterally*** Hoffman: *** Troemner: *** Pectoral: *** Palmomental: *** Facial: *** Midline tap: *** Sensation: Pinprick: *** Vibration: *** Temperature: *** Proprioception: *** Coordination: Intact finger-to- nose-finger and heel-to-shin bilaterally. Romberg negative.*** Gait: Able to rise from chair with arms crossed unassisted. Normal, narrow-based gait. Able to tandem walk. Able to walk on toes and heels.***   Lab and Test Review: New results: 02/09/23: IFE: no M protein B12: 394  11/22/22: TSH wnl  EMG (04/15/22): NCV & EMG Findings: Extensive electrodiagnostic evaluation of the right lower limb with additional needle examination of the left lower limb shows: Right sural and superficial peroneal/fibular sensory responses are within normal limits. Right peroneal/fibular (EDB) and tibial (AH) motor  responses are  within normal limits. Right H reflex is absent. Chronic motor axon loss changes without accompanying active denervation changes are seen in the right tibialis anterior, flexor digitorum longus, gluteus medius, medial head of gastrocnemius, short head of biceps femoris, and L5 lumbosacral paraspinal muscles. The right rectus femoris, left tibialis anterior, and left medial head of the gastrocnemius muscle are within normal limits with normal motor unit configuration and recruitment patterns.   Impression: This is an abnormal electrodiagnostic evaluation. 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 No electrodiagnostic evidence of a large fiber sensorimotor polyneuropathy.  MRI lumbar spine wo contrast (05/13/22): FINDINGS: Segmentation:  Standard.   Alignment: Convex right scoliosis with the apex at L1-2. Trace retrolisthesis L1 on L2 is identified. There is trace anterolisthesis L3 on L4, 0.5 cm anterolisthesis L4 on L5 and 0.5 cm anterolisthesis L5 on S1.   Vertebrae: No fracture, evidence of discitis, or bone lesion. Schmorl's node in the superior endplate of L2 is noted.   Conus medullaris and cauda equina: Conus extends to the T12-L1 level. Conus and cauda equina appear normal.   Paraspinal and other soft tissues: Left renal cysts incidentally noted and unchanged.   Disc levels:   T10-11 and T11-12 are imaged in the sagittal plane only and appear normal.   T12-L1: Negative.   L1-2: Mild-to-moderate facet degenerative change. A shallow left subarticular recess and foraminal protrusion is identified causing mild lateral recess and foraminal narrowing. The right foramen is open.   L2-3: Mild-to-moderate facet arthropathy, shallow disc bulge and ligamentum flavum thickening. There is mild central canal  narrowing. The foramina are open.   L3-4: Mild-to-moderate facet arthropathy with a shallow disc bulge and mild ligamentum flavum thickening. There is mild central canal and left foraminal narrowing. The right foramen is open.   L4-5: Moderate to advanced facet degenerative change is worse on the right. The disc is uncovered with a shallow bulge. There is narrowing in the right subarticular recess and mild right foraminal narrowing. The left foramen is open.   L5-S1: The disc is uncovered with a shallow bulge. The central canal and foramina remain open.   IMPRESSION: Narrowing in the right subarticular recess at L4-5 could impact the descending right L5 root.   Mild central canal and left foraminal narrowing L3-4.   Mild left lateral recess and foraminal narrowing L1-2.   Previously reviewed results: External labs: Normal or unremarkable: CMP CBC significant for MCV of 102  ASSESSMENT: This is Julian Hy, a 82 y.o. female with:  ***  Plan: ***  Return to clinic in ***  Total time spent reviewing records, interview, history/exam, documentation, and coordination of care on day of encounter:  *** min  Jacquelyne Balint, MD

## 2023-02-14 ENCOUNTER — Ambulatory Visit: Payer: Medicare Other | Admitting: Neurology

## 2023-04-30 DIAGNOSIS — M0589 Other rheumatoid arthritis with rheumatoid factor of multiple sites: Secondary | ICD-10-CM | POA: Diagnosis not present

## 2023-05-08 DIAGNOSIS — M25561 Pain in right knee: Secondary | ICD-10-CM | POA: Diagnosis not present

## 2023-05-27 NOTE — Progress Notes (Deleted)
 I saw April Terry in neurology clinic on 06/06/23 in follow up for tingling in feet and ankles.  HPI: April Terry is a 83 y.o. year old right-handed female with a medical history of HTN, HLD, RA (on sulfasalazine for 15 years), PMR, right sided sciatica, vit D deficiency, depression who we last saw on 02/08/22.  To briefly review: Patient has had symptoms for 10 years. She describes tingling in her feet and ankles. She describes feeling like paper is wrapped around her toes and occasional electric shocks. She also does not feel as steady on her feet as previous. Her toes can occasionally be sensitive to touch. Over the years, it has bothered patient more. She thinks it may be a little worse on the right foot compared to the left. She has not had any recent falls. She does not use an assistive device. She saw a neurologist in 2022. There was to be testing but patient did not follow up.  There no labs, imaging, or EMG that I can see.   She has no symptoms in her hands or arms. She does endorse occasional lower back pain but no radiation.   Per patient, symptoms are not currently affecting her lifestyle. She has never been on a medication for her symptoms and her symptoms are not painful enough currently that she would want symptomatic treatment.   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 This has been doing well, particularly with cleaning up her diet.   Patient is on sertraline 50 mg for depression.   The patient has not noticed any recent skin rashes nor does she report any constitutional symptoms like fever, night sweats, anorexia or unintentional weight loss.   EtOH use: 1 glass of wine per month  Restrictive diet? No Family history of neuropathy/myopathy/NM disease? Not known   Patient has not been on any supplements for at least 6 months (was on a multivitamin and vit D previously).  Most recent Assessment and Plan (02/08/22): April Terry is a 83 y.o.  female who presents for evaluation of numbness and tingling in feet. She has a relevant medical history of HTN, HLD, RA, PMR, right sided sciatica, vit D deficiency, depression. Her neurological examination is pertinent for diminished sensation in bilateral lower extremities and in bilateral hands (right greater than left). I have no previous testing (labs or otherwise) to evaluate. 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 There is some asymmetry, particularly in the feet that may be due to prior right sided sciatica or radiculopathy. I will do an EMG to establish the diagnosis of neuropathy and look for overlapping pathology. I will also send labs to look for treatable causes as I do not currently have known risk factors for neuropathy. Patient did not feel her symptoms warranted treatment at this time.   PLAN: -Blood work: B12, IFE. Attempted to order HbA1c but not covered by insurance -EMG: PN protocol (R > L) -Foot care discussed  Since their last visit: Labs were normal. EMG showed residuals of old right L5-S1, but no evidence of a large fiber polyneuropathy. Based on EMG, MRI lumbar spine was completed and did not show any significant stenosis. PT was offered. ***   ***  ROS: Pertinent positive and negative systems reviewed in HPI. ***   MEDICATIONS:  Outpatient Encounter Medications as of 06/06/2023  Medication Sig   atorvastatin (LIPITOR) 10 MG tablet Take 10 mg by mouth daily.   Calcium Carb-Cholecalciferol 1000-800 MG-UNIT TABS Take 1 tablet  by mouth daily.   irbesartan-hydrochlorothiazide (AVALIDE) 150-12.5 MG tablet TK 1 T PO QD IN THE EVE   meloxicam (MOBIC) 15 MG tablet Take 15 mg by mouth as needed.   multivitamin-lutein (OCUVITE-LUTEIN) CAPS capsule Take 1 capsule by mouth daily.   sertraline (ZOLOFT) 50 MG tablet Take 50 mg by mouth daily.   sulfaSALAzine (AZULFIDINE) 500 MG EC tablet Take 1,000 mg by mouth 2 (two) times daily.   Turmeric (QC TUMERIC  COMPLEX PO) Take 1,000 mg by mouth daily.   No facility-administered encounter medications on file as of 06/06/2023.    PAST MEDICAL HISTORY: Past Medical History:  Diagnosis Date   Essential hypertension    Fibroids    Per [facility]   H/O polymyalgia rheumatica    Was symptoms have mostly resolved after prolonged treatment with steroids   Hashimoto's thyroiditis    Per [facility]   Hyperlipidemia    Well-controlled on fish oil and low-dose Lipitor.   Lactose intolerance    Per [facility]   Major depressive disorder    Per [facility]   Osteopenia of hip, unspecified laterality    Per [facility]   Osteopenia of spine    Per [facility]   Polymyalgia rheumatica (HCC)    Per [facility]   Pure hypercholesterolemia    Per [facility]   Rheumatoid arthritis involving both hands (HCC)    Situational mixed anxiety and depressive disorder    Tingling of both feet    Per [facility]   Vitamin D deficiency    Per [facility]    PAST SURGICAL HISTORY: Past Surgical History:  Procedure Laterality Date   ABDOMINAL HYSTERECTOMY  1984   APPENDECTOMY  1950   COLONOSCOPY  2003   Per [facility]   COLONOSCOPY  2013   COLONOSCOPY  2024   Per [facility]   PARTIAL HYSTERECTOMY  1983   Ovaries retained, Per [facility]   TRANSTHORACIC ECHOCARDIOGRAM  02/2012   Mild concentric hypertrophy.  EF 55-60%.  No regional wall motion normality.  No mitral prolapse noted.    ALLERGIES: No Known Allergies  FAMILY HISTORY: Family History  Problem Relation Age of Onset   Obesity Mother    Heart failure Mother    Stroke Father    Heart disease Sister    Heart failure Sister    Arthritis Brother    Heart disease Brother    Colon cancer Brother        Per [facility]   Obesity Brother    Diabetes Brother        Per [facility]   Thyroid cancer Daughter        10-12 years ago as of 2024   Depression Son     Anxiety disorder Son    Alcoholism Son        Recovering   Heart failure Maternal Aunt        Per [facility]    SOCIAL HISTORY: Social History   Tobacco Use   Smoking status: Never   Smokeless tobacco: Never  Vaping Use   Vaping status: Never Used  Substance Use Topics   Alcohol use: Yes    Alcohol/week: 2.0 standard drinks of alcohol    Types: 2 Glasses of wine per week    Comment: 0-1 weekly   Drug use: No   Social History   Social History Narrative   Married Windy Fast) mother of 2, 3 grandchildren and 2 great-grandchildren  Are you right handed or left handed? Right   Are you currently employed ? no      Do you live at home alone? Live with husband of 61 years    Caffeine daily 2 cup   What type of home do you live in: 1 story or 2 story? one        Objective:  Vital Signs:  There were no vitals taken for this visit.  General:*** General appearance: Awake and alert. No distress. Cooperative with exam.  Skin: No obvious rash or jaundice. HEENT: Atraumatic. Anicteric. Lungs: Non-labored breathing on room air  Heart: Regular Abdomen: Soft, non tender. Extremities: No edema. No obvious deformity.  Musculoskeletal: No obvious joint swelling.  Neurological: Mental Status: Alert. Speech fluent. No pseudobulbar affect Cranial Nerves: CNII: No RAPD. Visual fields intact. CNIII, IV, VI: PERRL. No nystagmus. EOMI. CN V: Facial sensation intact bilaterally to fine touch. [place] clench strong. Jaw jerk***. CN VII: Facial muscles symmetric and strong. No ptosis at rest or after sustained upgaze***. CN VIII: Hears finger rub well bilaterally. CN IX: No hypophonia. CN X: Palate elevates symmetrically. CN XI: Full strength shoulder shrug bilaterally. CN XII: Tongue protrusion full and midline. No atrophy or fasciculations. No significant dysarthria*** Motor: Tone is ***. *** fasciculations in *** extremities. *** atrophy. No grip or percussive  myotonia.  Individual muscle group testing (MRC grade out of 5):  Movement     Neck flexion ***    Neck extension ***     Right Left   Shoulder abduction *** ***   Shoulder adduction *** ***   Shoulder ext rotation *** ***   Shoulder int rotation *** ***   Elbow flexion *** ***   Elbow extension *** ***   Wrist extension *** ***   Wrist flexion *** ***   Finger abduction - FDI *** ***   Finger abduction - ADM *** ***   Finger extension *** ***   Finger distal flexion - 2/3 *** ***   Finger distal flexion - 4/5 *** ***   Thumb flexion - FPL *** ***   Thumb abduction - APB *** ***    Hip flexion *** ***   Hip extension *** ***   Hip adduction *** ***   Hip abduction *** ***   Knee extension *** ***   Knee flexion *** ***   Dorsiflexion *** ***   Plantarflexion *** ***   Inversion *** ***   Eversion *** ***   Great toe extension *** ***   Great toe flexion *** ***     Reflexes:  Right Left  Bicep *** ***  Tricep *** ***  BrRad *** ***  Knee *** ***  Ankle *** ***   Pathological Reflexes: Babinski: *** response bilaterally*** Hoffman: *** Troemner: *** Pectoral: *** Palmomental: *** Facial: *** Midline tap: *** Sensation: Pinprick: *** Vibration: *** Temperature: *** Proprioception: *** Coordination: Intact finger-to- nose-finger and heel-to-shin bilaterally. Romberg negative.*** Gait: Able to rise from chair with arms crossed unassisted. Normal, narrow-based gait. Able to tandem walk. Able to walk on toes and heels.***   Lab and Test Review: New results: 02/09/23: IFE: no M protein B12: 394   11/22/22: TSH wnl   EMG (04/15/22): NCV & EMG Findings: Extensive electrodiagnostic evaluation of the right lower limb with additional needle examination of the left lower limb shows: Right sural and superficial peroneal/fibular sensory responses are within normal limits. Right peroneal/fibular (EDB) and tibial (AH) motor responses are within normal  limits.  Right H reflex is absent. Chronic motor axon loss changes without accompanying active denervation changes are seen in the right tibialis anterior, flexor digitorum longus, gluteus medius, medial head of gastrocnemius, short head of biceps femoris, and L5 lumbosacral paraspinal muscles. The right rectus femoris, left tibialis anterior, and left medial head of the gastrocnemius muscle are within normal limits with normal motor unit configuration and recruitment patterns.   Impression: This is an abnormal electrodiagnostic evaluation. 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 No electrodiagnostic evidence of a large fiber sensorimotor polyneuropathy.   MRI lumbar spine wo contrast (05/13/22): FINDINGS: Segmentation:  Standard.   Alignment: Convex right scoliosis with the apex at L1-2. Trace retrolisthesis L1 on L2 is identified. There is trace anterolisthesis L3 on L4, 0.5 cm anterolisthesis L4 on L5 and 0.5 cm anterolisthesis L5 on S1.   Vertebrae: No fracture, evidence of discitis, or bone lesion. Schmorl's node in the superior endplate of L2 is noted.   Conus medullaris and cauda equina: Conus extends to the T12-L1 level. Conus and cauda equina appear normal.   Paraspinal and other soft tissues: Left renal cysts incidentally noted and unchanged.   Disc levels:   T10-11 and T11-12 are imaged in the sagittal plane only and appear normal.   T12-L1: Negative.   L1-2: Mild-to-moderate facet degenerative change. A shallow left subarticular recess and foraminal protrusion is identified causing mild lateral recess and foraminal narrowing. The right foramen is open.   L2-3: Mild-to-moderate facet arthropathy, shallow disc bulge and ligamentum flavum thickening. There is mild central canal narrowing. The foramina  are open.   L3-4: Mild-to-moderate facet arthropathy with a shallow disc bulge and mild ligamentum flavum thickening. There is mild central canal and left foraminal narrowing. The right foramen is open.   L4-5: Moderate to advanced facet degenerative change is worse on the right. The disc is uncovered with a shallow bulge. There is narrowing in the right subarticular recess and mild right foraminal narrowing. The left foramen is open.   L5-S1: The disc is uncovered with a shallow bulge. The central canal and foramina remain open.   IMPRESSION: Narrowing in the right subarticular recess at L4-5 could impact the descending right L5 root.   Mild central canal and left foraminal narrowing L3-4.   Mild left lateral recess and foraminal narrowing L1-2.     Previously reviewed results: External labs: Normal or unremarkable: CMP CBC significant for MCV of 102  ASSESSMENT: This is April Terry, a 83 y.o. female with:  ***  Plan: ***  Return to clinic in ***  Total time spent reviewing records, interview, history/exam, documentation, and coordination of care on day of encounter:  *** min  April Balint, MD

## 2023-06-06 ENCOUNTER — Ambulatory Visit: Payer: Medicare Other | Admitting: Neurology

## 2023-07-02 DIAGNOSIS — M25561 Pain in right knee: Secondary | ICD-10-CM | POA: Diagnosis not present

## 2023-07-03 ENCOUNTER — Telehealth: Payer: Self-pay | Admitting: *Deleted

## 2023-07-03 NOTE — Telephone Encounter (Signed)
   Pre-operative Risk Assessment    Patient Name: April Terry  DOB: Feb 17, 1941 MRN: [id number]   Date of last office visit: 01/2022 Date of next office visit: NONE    Request for Surgical Clearance    Procedure:   RIGHT TKNA  Date of Surgery:  Clearance 09/22/23                                Surgeon:  DR. Ollen Gross Surgeon's Group or Practice Name:  [facility] Phone number:  [phone] Fax number:  [phone]   Type of Clearance Requested:   - Medical    Type of Anesthesia:   CHOICE   Additional requests/questions:    Wilhemina Cash   07/03/2023, 10:50 AM

## 2023-07-04 NOTE — Telephone Encounter (Signed)
    Primary Cardiologist:None  Chart reviewed as part of pre-operative protocol coverage. Because of April Terry past medical history and time since last visit, he/she will require a follow-up visit in order to better assess preoperative cardiovascular risk.  Pre-op covering staff: - Please schedule in office appointment and call patient to inform them. - Please contact requesting surgeon's office via preferred method (i.e, phone, fax) to inform them of need for appointment prior to surgery.  If applicable, this message will also be routed to pharmacy pool and/or primary cardiologist for input on holding anticoagulant/antiplatelet agent as requested below so that this information is available at time of patient's appointment.   Ronney Asters, NP  07/04/2023, 7:23 AM

## 2023-07-04 NOTE — Telephone Encounter (Signed)
 Spoke with patient who is agreeable to see Dr. Herbie Baltimore on 6/3 at 8:40 am. I will fax requesting provider's office the cardiac clearance updates.

## 2023-07-07 ENCOUNTER — Encounter: Payer: Self-pay | Admitting: Nurse Practitioner

## 2023-07-07 ENCOUNTER — Telehealth: Payer: Self-pay

## 2023-07-07 ENCOUNTER — Ambulatory Visit (INDEPENDENT_AMBULATORY_CARE_PROVIDER_SITE_OTHER): Admitting: Nurse Practitioner

## 2023-07-07 VITALS — BP 122/70 | HR 86 | Temp 97.1°F | Ht 64.0 in | Wt 154.8 lb

## 2023-07-07 DIAGNOSIS — M150 Primary generalized (osteo)arthritis: Secondary | ICD-10-CM | POA: Diagnosis not present

## 2023-07-07 DIAGNOSIS — M069 Rheumatoid arthritis, unspecified: Secondary | ICD-10-CM

## 2023-07-07 DIAGNOSIS — E785 Hyperlipidemia, unspecified: Secondary | ICD-10-CM

## 2023-07-07 DIAGNOSIS — I10 Essential (primary) hypertension: Secondary | ICD-10-CM

## 2023-07-07 DIAGNOSIS — Z01818 Encounter for other preprocedural examination: Secondary | ICD-10-CM

## 2023-07-07 NOTE — Telephone Encounter (Signed)
 Outgoing call placed to previous providers office [facility]) to request copy of immunizations. Spoke with Holy in medical records and she is planning to send immunization record today.

## 2023-07-07 NOTE — Progress Notes (Signed)
 Careteam: Patient Care Team: Sharon Seller, NP as PCP - General (Geriatric Medicine) Marykay Lex, MD as Consulting Physician (Cardiology)   PLACE OF SERVICE: [facility]  Advanced Directive information     No Known Allergies   Chief Complaint  Patient presents with   Pre-op Exam    Surgical clearance and form completion for [facility]. Will see cardiologist on 09/09/2023 - Dr.Harding. Discuss veins and need for referral.       HPI: Patient is a 83 y.o. female presents for surgical clearance.  Will have surgery June 16th, total knee replacement by Dr. Lequita Halt.  Cardiologist appointment scheduled for June 3rd.  Denies shortness of breath, palpitations, chest pain. No concerns, reports she feels good overall. In her normal state of health.  Reports some intermittent indigestion after caffeine intake. Denies known adverse effects to anesthesia in the past.   Back/knee pain- uses tylenol as needed, has prescription for meloxicam but rarely uses. She is to the point where she feels her knee is going to give out and ready for surgery. Has high hopes for surgery. Looking forward to being able to walk more and being more active.  Anxiety- controlled, on sertraline    Review of Systems:  Review of Systems  Constitutional:  Negative for chills, fever and weight loss.  Respiratory:  Negative for cough and shortness of breath.   Cardiovascular:  Negative for chest pain, palpitations and leg swelling.  Gastrointestinal:  Negative for abdominal pain, blood in stool, constipation, diarrhea, nausea and vomiting.  Genitourinary:  Negative for dysuria, frequency, hematuria and urgency.  Musculoskeletal:  Positive for back pain and joint pain.  Neurological:  Negative for dizziness, weakness and headaches.  Psychiatric/Behavioral:  Negative for depression. The patient is nervous/anxious. The patient does not have insomnia.     Past Medical History:  Diagnosis Date   Essential  hypertension    Fibroids    Per [facility]   H/O polymyalgia rheumatica    Was symptoms have mostly resolved after prolonged treatment with steroids   Hashimoto's thyroiditis    Per [facility]   Hyperlipidemia    Well-controlled on fish oil and low-dose Lipitor.   Lactose intolerance    Per [facility]   Major depressive disorder    Per [facility]   Osteopenia of hip, unspecified laterality    Per [facility]   Osteopenia of spine    Per [facility]   Polymyalgia rheumatica (HCC)    Per [facility]   Pure hypercholesterolemia    Per [facility]   Rheumatoid arthritis involving both hands (HCC)    Situational mixed anxiety and depressive disorder    Tingling of both feet    Per [facility]   Vitamin D deficiency    Per [facility]    Past Surgical History:  Procedure Laterality Date   ABDOMINAL HYSTERECTOMY  1984   APPENDECTOMY  1950   COLONOSCOPY  2003   Per [facility]   COLONOSCOPY  2013   COLONOSCOPY  2024   Per [facility]   PARTIAL HYSTERECTOMY  1983   Ovaries retained, Per [facility]   TRANSTHORACIC ECHOCARDIOGRAM  02/2012   Mild concentric hypertrophy.  EF 55-60%.  No regional wall motion normality.  No mitral prolapse noted.     Social History:   reports that she has never smoked. She has never used smokeless tobacco. She reports current alcohol use of about 2.0 standard drinks of alcohol per week. She reports  that she does not use drugs.  Family History  Problem Relation Age of Onset   Obesity Mother    Heart failure Mother    Stroke Father    Heart disease Sister    Heart failure Sister    Arthritis Brother    Heart disease Brother    Colon cancer Brother        Per [facility]   Obesity Brother    Diabetes Brother        Per [facility]   Thyroid cancer Daughter        10-12 years ago as of 2024   Depression Son    Anxiety disorder Son    Alcoholism Son         Recovering   Heart failure Maternal Aunt        Per [facility]     Medications:  Patient's Medications  New Prescriptions   No medications on file  Previous Medications   ACETAMINOPHEN (TYLENOL) 325 MG TABLET    Take 650 mg by mouth as needed.   ASCORBIC ACID (VITAMIN C) 500 MG TABLET    Take 500 mg by mouth daily.   ASPIRIN-ACETAMINOPHEN-CAFFEINE (EXCEDRIN MIGRAINE) 250-250-65 MG TABLET    Take 1 tablet by mouth as needed for headache.   ATORVASTATIN (LIPITOR) 10 MG TABLET    Take 10 mg by mouth daily.   CALCIUM CARB-CHOLECALCIFEROL 1000-800 MG-UNIT TABS    Take 1 tablet by mouth daily.   IRBESARTAN-HYDROCHLOROTHIAZIDE (AVALIDE) 150-12.5 MG TABLET    TK 1 T PO QD IN THE EVE   MELOXICAM (MOBIC) 15 MG TABLET    Take 15 mg by mouth as needed.   MULTIVITAMIN-LUTEIN (OCUVITE-LUTEIN) CAPS CAPSULE    Take 1 capsule by mouth daily.   SERTRALINE (ZOLOFT) 50 MG TABLET    Take 50 mg by mouth daily.   SULFASALAZINE (AZULFIDINE) 500 MG EC TABLET    Take 1,000 mg by mouth 2 (two) times daily.   TURMERIC (QC TUMERIC COMPLEX PO)    Take 1,000 mg by mouth daily.  Modified Medications   No medications on file  Discontinued Medications   No medications on file     Physical Exam:  Vitals:   07/07/23 1009  BP: 122/70  Pulse: 86  Temp: (!) 97.1 F (36.2 C)  SpO2: 98%  Weight: 154 lb 12.8 oz (70.2 kg)  Height: 5\' 4"  (1.626 m)   Body mass index is 26.57 kg/m.  Wt Readings from Last 3 Encounters:  07/07/23 154 lb 12.8 oz (70.2 kg)  01/13/23 153 lb (69.4 kg)  02/08/22 155 lb (70.3 kg)     Physical Exam Constitutional:      General: She is not in acute distress.    Appearance: Normal appearance. She is not ill-appearing or toxic-appearing.  HENT:     Right Ear: Tympanic membrane normal.     Left Ear: Tympanic membrane normal.     Nose: Nose normal.     Mouth/Throat:     Mouth: Mucous membranes are moist.     Pharynx: Oropharynx is clear.  Cardiovascular:     Rate and Rhythm:  Normal rate and regular rhythm.     Pulses: Normal pulses.     Heart sounds: Normal heart sounds.  Pulmonary:     Effort: Pulmonary effort is normal.     Breath sounds: Normal breath sounds.  Abdominal:     General: Bowel sounds are normal.     Palpations: Abdomen is soft.  Musculoskeletal:     Right lower leg: Edema (mild, non-pitting; knee swelling) present.  Skin:    General: Skin is warm and dry.     Comments: Varicose veins on right lower extremity   Neurological:     General: No focal deficit present.     Mental Status: She is alert and oriented to person, place, and time. Mental status is at baseline.  Psychiatric:        Mood and Affect: Mood normal.        Behavior: Behavior normal.        Thought Content: Thought content normal.        Judgment: Judgment normal.     Labs reviewed: Basic Metabolic Panel:  Recent Labs    11/22/22 0000  NA 140  K 4.5  CL 103  CO2 33*  BUN 20  CREATININE 0.8  CALCIUM 9.8  TSH 3.47   Liver Function Tests:  Recent Labs    11/22/22 0000  AST 26  ALT 30  ALBUMIN 4.6   No results for input(s): "LIPASE", "AMYLASE" in the last 8760 hours. No results for input(s): "AMMONIA" in the last 8760 hours. CBC:  Recent Labs    11/22/22 0000  WBC 6.1  HGB 13.9  HCT 41  PLT 226   Lipid Panel:  Recent Labs    11/22/22 0000  CHOL 147  HDL 51  LDLCALC 79  TRIG 96   TSH:  Recent Labs    11/22/22 0000  TSH 3.47   A1C: No results found for: "HGBA1C"   Assessment/Plan   1. Hyperlipidemia with target LDL less than 100 (Primary) -Continue atorvastatin  -Encouraged dietary modifications and physical activity as tolerated - Lipid panel  2. Primary hypertension -at goal. Continue Avalide -Controlled, at goal, <140/90 -Encouraged dietary modifications and physical activity as tolerated - COMPLETE METABOLIC PANEL WITHOUT GFR - CBC with Differential/Platelet  3. Primary osteoarthritis involving multiple joints -Continue tylenol  as needed for pain -TKA scheduled for June 16th  4. Rheumatoid arthritis involving multiple sites, unspecified whether rheumatoid factor present (HCC) -Continue sulfasalazine, symptoms controlled -Has cardiology clearance appointment June 3rd due to increase cardiac risk with RA.   5. Preoperative examination -Moderate risk due to age and rheumatoid arthritis/DMARD- - precautions for delirium,  -In her usual state of health and plans to get cardiology to sign off as well.    Follow up in 6months, sooner if needed  Rollen Sox, [facility] MSN-FNP Student I personally was present during the history, physical exam and medical decision-making activities of this service and have verified that the service and findings are accurately documented in the student's note Annalei Friesz K. Biagio Borg [facility] [phone]

## 2023-07-07 NOTE — Patient Instructions (Addendum)
 Please sign record request for immunization records   Cancel appt in APRIL -- make for 6 months. Marland Kitchen

## 2023-07-08 ENCOUNTER — Encounter: Payer: Self-pay | Admitting: Nurse Practitioner

## 2023-07-08 LAB — COMPLETE METABOLIC PANEL WITHOUT GFR
AG Ratio: 2.3 (calc) (ref 1.0–2.5)
ALT: 19 U/L (ref 6–29)
AST: 19 U/L (ref 10–35)
Albumin: 4.5 g/dL (ref 3.6–5.1)
Alkaline phosphatase (APISO): 33 U/L — ABNORMAL LOW (ref 37–153)
BUN: 13 mg/dL (ref 7–25)
CO2: 29 mmol/L (ref 20–32)
Calcium: 9.8 mg/dL (ref 8.6–10.4)
Chloride: 105 mmol/L (ref 98–110)
Creat: 0.71 mg/dL (ref 0.60–0.95)
Globulin: 2 g/dL (ref 1.9–3.7)
Glucose, Bld: 95 mg/dL (ref 65–139)
Potassium: 3.8 mmol/L (ref 3.5–5.3)
Sodium: 143 mmol/L (ref 135–146)
Total Bilirubin: 0.7 mg/dL (ref 0.2–1.2)
Total Protein: 6.5 g/dL (ref 6.1–8.1)

## 2023-07-08 LAB — CBC WITH DIFFERENTIAL/PLATELET
Absolute Lymphocytes: 1260 {cells}/uL (ref 850–3900)
Absolute Monocytes: 540 {cells}/uL (ref 200–950)
Basophils Absolute: 48 {cells}/uL (ref 0–200)
Basophils Relative: 0.8 %
Eosinophils Absolute: 288 {cells}/uL (ref 15–500)
Eosinophils Relative: 4.8 %
HCT: 39.8 % (ref 35.0–45.0)
Hemoglobin: 13.5 g/dL (ref 11.7–15.5)
MCH: 34.9 pg — ABNORMAL HIGH (ref 27.0–33.0)
MCHC: 33.9 g/dL (ref 32.0–36.0)
MCV: 102.8 fL — ABNORMAL HIGH (ref 80.0–100.0)
MPV: 10 fL (ref 7.5–12.5)
Monocytes Relative: 9 %
Neutro Abs: 3864 {cells}/uL (ref 1500–7800)
Neutrophils Relative %: 64.4 %
Platelets: 237 10*3/uL (ref 140–400)
RBC: 3.87 10*6/uL (ref 3.80–5.10)
RDW: 11.9 % (ref 11.0–15.0)
Total Lymphocyte: 21 %
WBC: 6 10*3/uL (ref 3.8–10.8)

## 2023-07-08 LAB — LIPID PANEL
Cholesterol: 153 mg/dL (ref <200)
HDL: 63 mg/dL (ref >=50)
LDL Cholesterol (Calc): 71 mg/dL
Non-HDL Cholesterol (Calc): 90 mg/dL (ref <130)
Total CHOL/HDL Ratio: 2.4 (calc) (ref <5.0)
Triglycerides: 107 mg/dL (ref <150)

## 2023-07-21 ENCOUNTER — Ambulatory Visit: Payer: Medicare Other | Admitting: Nurse Practitioner

## 2023-08-07 DIAGNOSIS — M1711 Unilateral primary osteoarthritis, right knee: Secondary | ICD-10-CM | POA: Diagnosis not present

## 2023-08-07 DIAGNOSIS — G8929 Other chronic pain: Secondary | ICD-10-CM | POA: Diagnosis not present

## 2023-08-13 ENCOUNTER — Telehealth: Payer: Self-pay | Admitting: Cardiology

## 2023-08-13 NOTE — Telephone Encounter (Signed)
 I s/w the pt and assured her that the appt 09/09/23 with Dr. Addie Holstein is fine for her to keep for preop clearance. Pt said thank you for my call back and the help.

## 2023-08-13 NOTE — Telephone Encounter (Signed)
 See previous pre-op encounter, 3/27. Patient is following up because knee replacement has been rescheduled for 7/21. She would like to know if 6/03 appointment for clearance needs to be pushed out. She says she was initially advised the clearance needs to be within a few weeks of procedure and no more than that. Please advise.

## 2023-09-09 ENCOUNTER — Encounter: Payer: Self-pay | Admitting: Cardiology

## 2023-09-09 ENCOUNTER — Ambulatory Visit: Attending: Cardiology | Admitting: Cardiology

## 2023-09-09 VITALS — BP 128/58 | HR 76 | Ht 63.0 in | Wt 159.0 lb

## 2023-09-09 DIAGNOSIS — Z01810 Encounter for preprocedural cardiovascular examination: Secondary | ICD-10-CM | POA: Insufficient documentation

## 2023-09-09 DIAGNOSIS — Z01818 Encounter for other preprocedural examination: Secondary | ICD-10-CM | POA: Diagnosis not present

## 2023-09-09 DIAGNOSIS — R002 Palpitations: Secondary | ICD-10-CM

## 2023-09-09 DIAGNOSIS — I10 Essential (primary) hypertension: Secondary | ICD-10-CM | POA: Diagnosis not present

## 2023-09-09 DIAGNOSIS — Z8249 Family history of ischemic heart disease and other diseases of the circulatory system: Secondary | ICD-10-CM | POA: Diagnosis not present

## 2023-09-09 DIAGNOSIS — I728 Aneurysm of other specified arteries: Secondary | ICD-10-CM

## 2023-09-09 DIAGNOSIS — E785 Hyperlipidemia, unspecified: Secondary | ICD-10-CM | POA: Diagnosis not present

## 2023-09-09 NOTE — Progress Notes (Signed)
 Cardiology Office Note:  .   Date:  09/13/2023  ID:  Ermalene Haymaker, DOB [birth year], MRN [id number] PCP: Verma Gobble, NP  [facility] Providers Cardiologist:  Randene Bustard, MD     Chief Complaint  Patient presents with   Follow-up    Referred for preop assessment for knee surgery although she has no active cardiac issues.    Patient Profile: .     April Terry is a 83 y.o. female with a PMH notable for labile hypertension, hyperlipidemia, occasional palpitations and family history of aortic aneurysm who presents here for almost 3-year follow-up -> For Preop Risk Assessment at the request of Dr. France Ina. Her husband April Terry is also a patient of mine.   April Terry was last seen on January 24, 2021 for follow-up evaluation of palpitations.  Doing well.  No further palpitations and not using a beta-blocker.  Staying active walking her dog, doing yard work and working in the garden.  Also shopping routinely.  Subjective  Discussed the use of AI scribe software for clinical note transcription with the patient, who gave verbal consent to proceed.  History of Present Illness  History of Present Illness April Terry is an 83 year old female who presents for pre-operative clearance for knee replacement surgery.  She is scheduled for a right total knee replacement on July 21st. She has been receiving cortisone injections approximately every four months for several years to manage knee pain, which allows her to walk without issues until the effects wear off. Her knee pain limits her ability to walk more than one or two blocks and makes climbing stairs slow and cautious to avoid falls. She is unable to run and avoids activities that require kneeling, such as scrubbing floors, but can perform light housework and gardening.  There is a significant family history of aortic aneurysms, including her mother, grandmother, aunt, sister, and several cousins. Additionally, her father had a  stroke in his mid-eighties.  No history of heart attack, heart failure, stroke, diabetes, or kidney dysfunction. No chest pain, pressure, tightness, shortness of breath, palpitations, or any heart-related symptoms. Her current medications include irubastatin and atorvastatin for cholesterol management. She occasionally takes Excedrin for migraines and Tylenol or Aleve for inflammation.  She recently changed her primary care provider to Dr. Herbert Loh at the [facility]. She is active for her age, engaging in activities such as shopping, playing cards, and traveling, although she used a wheelchair at the airport for a long-distance trip last September.    Objective   Cardiac Meds: Atorvastatin 1000 daily, Avalide (irbesartan HCTZ) 150-12.5 mg daily  Studies Reviewed: Aaron Aas   EKG Interpretation Date/Time:  Tuesday September 09 2023 09:05:27 EDT Ventricular Rate:  76 PR Interval:  166 QRS Duration:  78 QT Interval:  392 QTC Calculation: 441 R Axis:   29  Text Interpretation: Normal sinus rhythm Possible Left atrial enlargement No previous ECGs available Confirmed by Randene Bustard ([id number]) on 09/09/2023 9:16:53 AM    Lab Results  Component Value Date   CHOL 153 07/07/2023   HDL 63 07/07/2023   LDLCALC 71 07/07/2023   TRIG 107 07/07/2023   CHOLHDL 2.4 07/07/2023   Lab Results  Component Value Date   NA 143 07/07/2023   K 3.8 07/07/2023   CREATININE 0.71 07/07/2023   EGFR 73 11/22/2022   GLUCOSE 95 07/07/2023   Risk Assessment/Calculations:              Ms. April Terry perioperative  risk of a major cardiac event is 0.4% according to the Revised Cardiac Risk Index (RCRI).  Therefore, she is at low risk for perioperative complications.   Her functional capacity is good at 6.27 METs according to the Duke Activity Status Index (DASI). Recommendations: According to ACC/AHA guidelines, no further cardiovascular testing needed.  The patient may proceed to surgery at acceptable  risk.   Antiplatelet and/or Anticoagulation Recommendations: Not on standing ASA Not on OAC    Physical Exam:   VS:  BP (!) 128/58 (BP Location: Right Arm, Patient Position: Sitting, Cuff Size: Normal)   Pulse 76   Ht 5\' 3"  (1.6 m)   Wt 159 lb (72.1 kg)   SpO2 94%   BMI 28.17 kg/m    Wt Readings from Last 3 Encounters:  09/09/23 159 lb (72.1 kg)  07/07/23 154 lb 12.8 oz (70.2 kg)  01/13/23 153 lb (69.4 kg)    GEN: Well nourished, well groomed in no acute distress; healthy appearing NECK: No JVD; No carotid bruits CARDIAC: Normal S1, S2; RRR, no murmurs, rubs, gallops RESPIRATORY:  Clear to auscultation without rales, wheezing or rhonchi ; nonlabored, good air movement. ABDOMEN: Soft, non-tender, non-distended EXTREMITIES:  No edema; No deformity - R knee painful & mildly swollen    ASSESSMENT AND PLAN: .    Problem List Items Addressed This Visit       Cardiology Problems   Aneurysm of splenic artery (HCC) (Chronic)   - Schedule CT angiogram of the aorta following knee surgery in the fall. - Verify current kidney function test before CT angiogram. - Coordinate CT angiogram after September visit with Dr. Roselie Conger.      Relevant Orders   CT ANGIO CHEST AORTA W/ & OR WO/CM & GATING (HEART & VASCULAR TOWER ONLY)   CT ANGIO ABDOMEN PELVIS  W & WO CONTRAST   Hyperlipidemia with target LDL less than 100 (Chronic)   On atorvastatin 10 mg daily.  Monitored by PCP.  Most recent LDL was 71 in March 2025.  Well within goal.      Labile essential hypertension (Chronic)   Blood pressure seems to be stable on current meds.  Managed by PCP. -Continue Avalide 150 and 12.5 mg daily.        Other   Family history of aortic aneurysm (Chronic)   Family history of aortic aneurysms and stroke. Screening CT angiogram of the aorta considered post-knee surgery. - Schedule CT angiogram of the aorta following knee surgery in the fall. - Verify current kidney function test before CT  angiogram. - Coordinate CT angiogram after September visit with Dr. Roselie Conger.      Relevant Orders   CT ANGIO CHEST AORTA W/ & OR WO/CM & GATING (HEART & VASCULAR TOWER ONLY)   CT ANGIO ABDOMEN PELVIS  W & WO CONTRAST   Palpitations (Chronic)   Essentially nonexistent.  Not on beta-blocker      Preop cardiovascular exam - Primary   Right knee osteoarthritis Significant pain and mobility limitations. Total knee arthroplasty scheduled. No cardiac contraindications for surgery. - Proceed with total knee arthroplasty on October 27, 2023. - Inform Dr. France Ina of surgical clearance.  Ms. Kue perioperative risk of a major cardiac event is 0.4% according to the Revised Cardiac Risk Index (RCRI).  Therefore, she is at low risk for perioperative complications.   Her functional capacity is good at 6.27 METs according to the Duke Activity Status Index (DASI). Recommendations: According to ACC/AHA guidelines, no further cardiovascular testing  needed.  The patient may proceed to surgery at acceptable risk.   Antiplatelet and/or Anticoagulation Recommendations: Not on standing ASA Not on OAC      Relevant Orders   EKG 12-Lead (Completed)         Follow-Up: Return if symptoms worsen or fail to improve, for Followup when necessary.     Signed, Arleen Lacer, MD, MS Randene Bustard, M.D., M.S. Interventional Chartered certified accountant  Pager # [phone]

## 2023-09-09 NOTE — Patient Instructions (Addendum)
 Medication Instructions:   No changes   *If you need a refill on your cardiac medications before your next appointment, please call your pharmacy*   Lab Work: Not needed    Testing/Procedures:  Schedule in Late Sept or Oct 2025  .Non-Cardiac CT Angiography (CTA), is a special type of CT scan that uses a computer to produce multi-dimensional views of Aorta . In CT angiography, a contrast material is injected through an IV to help visualize the blood vessels    Follow-Up: At [facility], you and your health needs are our priority.  As part of our continuing mission to provide you with exceptional heart care, we have created designated Provider Care Teams.  These Care Teams include your primary Cardiologist (physician) and Advanced Practice Providers (APPs -  Physician Assistants and Nurse Practitioners) who all work together to provide you with the care you need, when you need it.     Your next appointment:   As needed  The format for your next appointment:    As needed  Provider:   Dr Randene Bustard    Other Instructions  You are cleared to have surgery from a cardiac standpoint

## 2023-09-13 ENCOUNTER — Encounter: Payer: Self-pay | Admitting: Cardiology

## 2023-09-13 NOTE — Assessment & Plan Note (Signed)
 Blood pressure seems to be stable on current meds.  Managed by PCP. -Continue Avalide 150 and 12.5 mg daily.

## 2023-09-13 NOTE — Assessment & Plan Note (Signed)
 Essentially nonexistent.  Not on beta-blocker

## 2023-09-13 NOTE — Assessment & Plan Note (Signed)
 Right knee osteoarthritis Significant pain and mobility limitations. Total knee arthroplasty scheduled. No cardiac contraindications for surgery. - Proceed with total knee arthroplasty on October 27, 2023. - Inform Dr. France Ina of surgical clearance.  April Terry perioperative risk of a major cardiac event is 0.4% according to the Revised Cardiac Risk Index (RCRI).  Therefore, she is at low risk for perioperative complications.   Her functional capacity is good at 6.27 METs according to the Duke Activity Status Index (DASI). Recommendations: According to ACC/AHA guidelines, no further cardiovascular testing needed.  The patient may proceed to surgery at acceptable risk.   Antiplatelet and/or Anticoagulation Recommendations: Not on standing ASA Not on OAC

## 2023-09-13 NOTE — Assessment & Plan Note (Signed)
 On atorvastatin 10 mg daily.  Monitored by PCP.  Most recent LDL was 71 in March 2025.  Well within goal.

## 2023-09-13 NOTE — Assessment & Plan Note (Signed)
 Family history of aortic aneurysms and stroke. Screening CT angiogram of the aorta considered post-knee surgery. - Schedule CT angiogram of the aorta following knee surgery in the fall. - Verify current kidney function test before CT angiogram. - Coordinate CT angiogram after September visit with Dr. Roselie Conger.

## 2023-09-13 NOTE — Assessment & Plan Note (Signed)
-   Schedule CT angiogram of the aorta following knee surgery in the fall. - Verify current kidney function test before CT angiogram. - Coordinate CT angiogram after September visit with Dr. Roselie Conger.

## 2023-09-30 DIAGNOSIS — M25661 Stiffness of right knee, not elsewhere classified: Secondary | ICD-10-CM | POA: Diagnosis not present

## 2023-09-30 DIAGNOSIS — M1711 Unilateral primary osteoarthritis, right knee: Secondary | ICD-10-CM | POA: Diagnosis not present

## 2023-09-30 DIAGNOSIS — M25561 Pain in right knee: Secondary | ICD-10-CM | POA: Diagnosis not present

## 2023-09-30 DIAGNOSIS — G8929 Other chronic pain: Secondary | ICD-10-CM | POA: Diagnosis not present

## 2023-09-30 NOTE — H&P (Signed)
 TOTAL KNEE ADMISSION H&P  Patient is being admitted for right total knee arthroplasty.  Subjective:  Chief Complaint: Right knee pain.  HPI: April Terry, 83 y.o. female has a history of pain and functional disability in the right knee due to arthritis and has failed non-surgical conservative treatments for greater than 12 weeks to include NSAID's and/or analgesics, corticosteriod injections, and activity modification. Onset of symptoms was gradual, starting >10 years ago with gradually worsening course since that time. The patient noted no past surgery on the right knee.  Patient currently rates pain in the right knee at 9 out of 10 with activity. Patient has night pain, worsening of pain with activity and weight bearing, pain with passive range of motion, and crepitus. Patient has evidence of bone-on-bone arthritis in the lateral compartment and near bone-on-bone patellofemoral arthritis. Large osteophytes are present by imaging studies. There is no active infection.  Patient Active Problem List   Diagnosis Date Noted   Aneurysm of splenic artery (HCC) 09/09/2023   Family history of aortic aneurysm 09/09/2023   Preop cardiovascular exam 09/09/2023   Palpitations 02/10/2019   Labile essential hypertension 05/12/2017   Hyperlipidemia with target LDL less than 100 05/12/2017    Past Medical History:  Diagnosis Date   Essential hypertension    Fibroids    Per [facility]   H/O polymyalgia rheumatica    Was symptoms have mostly resolved after prolonged treatment with steroids   Hashimoto's thyroiditis    Per [facility]   Hyperlipidemia    Well-controlled on fish oil and low-dose Lipitor.   Lactose intolerance    Per [facility]   Major depressive disorder    Per [facility]   Osteopenia of hip, unspecified laterality    Per [facility]   Osteopenia of spine    Per [facility]   Polymyalgia rheumatica (HCC)    Per [facility]   Pure  hypercholesterolemia    Per [facility]   Rheumatoid arthritis involving both hands (HCC)    Situational mixed anxiety and depressive disorder    Tingling of both feet    Per [facility]   Vitamin D  deficiency    Per [facility]    Past Surgical History:  Procedure Laterality Date   ABDOMINAL HYSTERECTOMY  1984   APPENDECTOMY  1950   COLONOSCOPY  2003   Per [facility]   COLONOSCOPY  2013   COLONOSCOPY  2024   Per [facility]   PARTIAL HYSTERECTOMY  1983   Ovaries retained, Per [facility]   TRANSTHORACIC ECHOCARDIOGRAM  02/2012   Mild concentric hypertrophy.  EF 55-60%.  No regional wall motion normality.  No mitral prolapse noted.    Prior to Admission medications   Medication Sig Start Date End Date Taking? Authorizing Provider  acetaminophen (TYLENOL) 325 MG tablet Take 650 mg by mouth as needed.    [provider]  ascorbic acid (VITAMIN C) 500 MG tablet Take 500 mg by mouth daily.    [provider]  aspirin-acetaminophen-caffeine (EXCEDRIN MIGRAINE) 250-250-65 MG tablet Take 1 tablet by mouth as needed for headache.    [provider]  atorvastatin (LIPITOR) 10 MG tablet Take 10 mg by mouth daily.    [provider]  Calcium Carb-Cholecalciferol 1000-800 MG-UNIT TABS Take 1 tablet by mouth daily.    [provider]  irbesartan-hydrochlorothiazide (AVALIDE) 150-12.5 MG tablet TK 1 T PO QD IN THE EVE 05/29/18   [provider]  multivitamin-lutein (OCUVITE-LUTEIN)  CAPS capsule Take 1 capsule by mouth daily.    [provider]  sertraline (ZOLOFT) 50 MG tablet Take 50 mg by mouth daily. 02/21/17   [provider]  sulfaSALAzine (AZULFIDINE) 500 MG EC tablet Take 1,000 mg by mouth 2 (two) times daily. 04/21/17   [provider]  Turmeric (QC TUMERIC COMPLEX PO) Take 1,000 mg by mouth daily.    [provider]    No Known Allergies  Social History    Socioeconomic History   Marital status: Married    Spouse name: Tanda   Number of children: 2   Years of education: Not on file   Highest education level: Some college, no degree  Occupational History   Occupation: Manufacturing engineer    Comment: Retired.  Used to work in [employer] .  Tobacco Use   Smoking status: Never   Smokeless tobacco: Never  Vaping Use   Vaping status: Never Used  Substance and Sexual Activity   Alcohol use: Yes    Alcohol/week: 2.0 standard drinks of alcohol    Types: 2 Glasses of wine per week    Comment: 0-1 weekly   Drug use: No   Sexual activity: Not Currently  Other Topics Concern   Not on file  Social History Narrative   Married Isadora) mother of 2, 3 grandchildren and 2 great-grandchildren   Are you right handed or left handed? Right   Are you currently employed ? no      Do you live at home alone? Live with husband of 61 years    Caffeine daily 2 cup   What type of home do you live in: 1 story or 2 story? one       Social Drivers of Corporate investment banker Strain: Low Risk  (07/04/2023)   Overall Financial Resource Strain (CARDIA)    Difficulty of Paying Living Expenses: Not hard at all  Food Insecurity: No Food Insecurity (07/04/2023)   Hunger Vital Sign    Worried About Running Out of Food in the Last Year: Never true    Ran Out of Food in the Last Year: Never true  Transportation Needs: No Transportation Needs (07/04/2023)   PRAPARE - Administrator, Civil Service (Medical): No    Lack of Transportation (Non-Medical): No  Physical Activity: Inactive (07/04/2023)   Exercise Vital Sign    Days of Exercise per Week: 0 days    Minutes of Exercise per Session: 30 min  Stress: No Stress Concern Present (07/04/2023)   [employer] of Occupational Health - Occupational Stress Questionnaire    Feeling of Stress : Only a little  Social Connections: Moderately Integrated (07/04/2023)   Social Connection and Isolation  Panel    Frequency of Communication with Friends and Family: More than three times a week    Frequency of Social Gatherings with Friends and Family: Once a week    Attends Religious Services: Never    Database administrator or Organizations: Yes    Attends Engineer, structural: More than 4 times per year    Marital Status: Married  Catering manager Violence: Not on file    Tobacco Use: Low Risk  (09/13/2023)   Patient History    Smoking Tobacco Use: Never    Smokeless Tobacco Use: Never    Passive Exposure: Not on file   Social History   Substance and Sexual Activity  Alcohol Use Yes   Alcohol/week: 2.0 standard drinks of alcohol  Types: 2 Glasses of wine per week   Comment: 0-1 weekly    Family History  Problem Relation Age of Onset   Obesity Mother    Heart failure Mother    Stroke Father    Heart disease Sister    Heart failure Sister    Arthritis Brother    Heart disease Brother    Colon cancer Brother        Per [facility]   Obesity Brother    Diabetes Brother        [facility]   Thyroid  cancer Daughter        10-12 years ago as of 2024   Depression Son    Anxiety disorder Son    Alcoholism Son        Recovering   Heart failure Maternal Aunt        Per [facility]    Review of Systems  Constitutional:  Negative for chills and fever.  HENT:  Negative for congestion, sore throat and tinnitus.   Eyes:  Negative for double vision, photophobia and pain.  Respiratory:  Negative for cough, shortness of breath and wheezing.   Cardiovascular:  Negative for chest pain, palpitations and orthopnea.  Gastrointestinal:  Negative for heartburn, nausea and vomiting.  Genitourinary:  Negative for dysuria, frequency and urgency.  Musculoskeletal:  Positive for joint pain.  Neurological:  Negative for dizziness, weakness and headaches.    Objective:  Physical Exam: Well nourished and well developed.  General: Alert and oriented x3,  cooperative and pleasant, no acute distress.  Head: normocephalic, atraumatic, neck supple.  Eyes: EOMI.  Musculoskeletal:  Right knee shows slight valgus, range of motion approximately 5-125 degrees with crepitus on range of motion. Tender lateral greater than medial. No instability noted.  Calves soft and nontender. Motor function intact in LE. Strength 5/5 LE bilaterally. Neuro: Distal pulses 2+. Sensation to light touch intact in LE.  Imaging Review Plain radiographs demonstrate severe degenerative joint disease of the right knee. The overall alignment is mild valgus. The bone quality appears to be adequate for age and reported activity level.  Assessment/Plan:  End stage arthritis, right knee   The patient history, physical examination, clinical judgment of the provider and imaging studies are consistent with end stage degenerative joint disease of the right knee and total knee arthroplasty is deemed medically necessary. The treatment options including medical management, injection therapy arthroscopy and arthroplasty were discussed at length. The risks and benefits of total knee arthroplasty were presented and reviewed. The risks due to aseptic loosening, infection, stiffness, patella tracking problems, thromboembolic complications and other imponderables were discussed. The patient acknowledged the explanation, agreed to proceed with the plan and consent was signed. Patient is being admitted for inpatient treatment for surgery, pain control, PT, OT, prophylactic antibiotics, VTE prophylaxis, progressive ambulation and ADLs and discharge planning. The patient is planning to be discharged home.   Patient's anticipated LOS is less than 2 midnights, meeting these requirements: - Lives within 1 hour of care - Has a competent adult at home to recover with post-op recover - NO history of  - Chronic pain requiring opiods  - Diabetes  - Coronary Artery Disease  - Heart failure  - Heart  attack  - Stroke  - DVT/VTE  - Cardiac arrhythmia  - Respiratory Failure/COPD  - Renal failure  - Anemia  - Advanced Liver disease  Therapy Plans: Outpatient therapy at [facility] Disposition: Home with husband/daughter Planned DVT Prophylaxis: Aspirin 81  mg BID DME Needed: None PCP: Harlene An, NP (clearance received) Cardiologist: Alm Clay, MD (clearance received) TXA: IV Allergies: NKDA Metal Allergy: None Anesthesia Concerns: None BMI: 27.6 Last HgbA1c: Not diabetic Pain Regimen: Oxycodone [facility]acy: [facility]  - Patient was instructed on what medications to stop prior to surgery. - Follow-up visit in 2 weeks with Dr. Melodi - Begin physical therapy following surgery - Pre-operative lab work as pre-surgical testing - Prescriptions will be provided in hospital at time of discharge  Roxie Mess, PA-C Orthopedic Surgery [facility] [place]

## 2023-10-16 NOTE — Patient Instructions (Addendum)
 SURGICAL WAITING ROOM VISITATION Patients having surgery or a procedure may have no more than 2 support people in the waiting area - these visitors may rotate.    Children under the age of 44 must have an adult with them who is not the patient.  If the patient needs to stay at the hospital during part of their recovery, the visitor guidelines for inpatient rooms apply. Pre-op nurse will coordinate an appropriate time for 1 support person to accompany patient in pre-op.  This support person may not rotate.    Please refer to the [facility] website for the visitor guidelines for Inpatients (after your surgery is over and you are in a regular room).       Your procedure is scheduled on: 10-27-23   Report to [facility] Main Entrance    Report to admitting at 8:00 AM   Call this number if you have problems the morning of surgery [phone]   Do not eat food :After Midnight.   After Midnight you may have the following liquids until 7:30 AM DAY OF SURGERY  Water Non-Citrus Juices (without pulp, NO RED-Apple, White grape, White cranberry) Black Coffee (NO MILK/CREAM OR CREAMERS, sugar ok)  Clear Tea (NO MILK/CREAM OR CREAMERS, sugar ok) regular and decaf                             Plain Jell-O (NO RED)                                           Fruit ices (not with fruit pulp, NO RED)                                     Popsicles (NO RED)                                                               Sports drinks like Gatorade (NO RED)                   The day of surgery:  Drink ONE (1) Pre-Surgery Clear Ensure by 7:30 AM the morning of surgery. Drink in one sitting. Do not sip.  This drink was given to you during your hospital  pre-op appointment visit. Nothing else to drink after completing the Pre-Surgery Clear Ensure.          If you have questions, please contact your surgeon's office.   FOLLOW ANY ADDITIONAL PRE OP INSTRUCTIONS YOU RECEIVED FROM YOUR SURGEON'S  OFFICE!!!     Oral Hygiene is also important to reduce your risk of infection.                                    Remember - BRUSH YOUR TEETH THE MORNING OF SURGERY WITH YOUR REGULAR TOOTHPASTE   Do NOT smoke after Midnight   Take these medicines the morning of surgery with A SIP OF WATER:    Atorvastatin   Sertraline  Tylenol if needed  Stop all vitamins and herbal supplements 7 days before surgery                              You may not have any metal on your body including hair pins, jewelry, and body piercing             Do not wear make-up, lotions, powders, perfumes, or deodorant  Do not wear nail polish including gel and S&S, artificial/acrylic nails, or any other type of covering on natural nails including finger and toenails. If you have artificial nails, gel coating, etc. that needs to be removed by a nail salon please have this removed prior to surgery or surgery may need to be canceled/ delayed if the surgeon/ anesthesia feels like they are unable to be safely monitored.   Do not shave  48 hours prior to surgery.    Do not bring valuables to the hospital. [facility] IS NOT RESPONSIBLE   FOR VALUABLES.   Contacts, dentures or bridgework may not be worn into surgery.   Bring small overnight bag day of surgery.   DO NOT BRING YOUR HOME MEDICATIONS TO THE HOSPITAL. PHARMACY WILL DISPENSE MEDICATIONS LISTED ON YOUR MEDICATION LIST TO YOU DURING YOUR ADMISSION IN THE HOSPITAL!   Special Instructions: Bring a copy of your healthcare power of attorney and living will documents the day of surgery if you haven't scanned them before.              Please read over the following fact sheets you were given: IF YOU HAVE QUESTIONS ABOUT YOUR PRE-OP INSTRUCTIONS PLEASE CALL [phone] Gwen  If you received a COVID test during your pre-op visit  it is requested that you wear a mask when out in public, stay away from anyone that may not be feeling well and notify your surgeon if you  develop symptoms. If you test positive for Covid or have been in contact with anyone that has tested positive in the last 10 days please notify you surgeon.    Pre-operative 5 CHG Bath Instructions   You can play a key role in reducing the risk of infection after surgery. Your skin needs to be as free of germs as possible. You can reduce the number of germs on your skin by washing with CHG (chlorhexidine gluconate) soap before surgery. CHG is an antiseptic soap that kills germs and continues to kill germs even after washing.   DO NOT use if you have an allergy to chlorhexidine/CHG or antibacterial soaps. If your skin becomes reddened or irritated, stop using the CHG and notify one of our RNs at [phone].   Please shower with the CHG soap starting 4 days before surgery using the following schedule:     Please keep in mind the following:  DO NOT shave, including legs and underarms, starting the day of your first shower.   You may shave your face at any point before/day of surgery.  Place clean sheets on your bed the day you start using CHG soap. Use a clean washcloth (not used since being washed) for each shower. DO NOT sleep with pets once you start using the CHG.   CHG Shower Instructions:  If you choose to wash your hair and private area, wash first with your normal shampoo/soap.  After you use shampoo/soap, rinse your hair and body thoroughly to remove shampoo/soap residue.  Turn the water OFF and apply  about 3 tablespoons (45 ml) of CHG soap to a CLEAN washcloth.  Apply CHG soap ONLY FROM YOUR NECK DOWN TO YOUR TOES (washing for 3-5 minutes)  DO NOT use CHG soap on face, private areas, open wounds, or sores.  Pay special attention to the area where your surgery is being performed.  If you are having back surgery, having someone wash your back for you may be helpful. Wait 2 minutes after CHG soap is applied, then you may rinse off the CHG soap.  Pat dry with a clean towel  Put on  clean clothes/pajamas   If you choose to wear lotion, please use ONLY the CHG-compatible lotions on the back of this paper.     Additional instructions for the day of surgery: DO NOT APPLY any lotions, deodorants, cologne, or perfumes.   Put on clean/comfortable clothes.  Brush your teeth.  Ask your nurse before applying any prescription medications to the skin.      CHG Compatible Lotions   Aveeno Moisturizing lotion  Cetaphil Moisturizing Cream  Cetaphil Moisturizing Lotion  Clairol Herbal Essence Moisturizing Lotion, Dry Skin  Clairol Herbal Essence Moisturizing Lotion, Extra Dry Skin  Clairol Herbal Essence Moisturizing Lotion, Normal Skin  Curel Age Defying Therapeutic Moisturizing Lotion with Alpha Hydroxy  Curel Extreme Care Body Lotion  Curel Soothing Hands Moisturizing Hand Lotion  Curel Therapeutic Moisturizing Cream, Fragrance-Free  Curel Therapeutic Moisturizing Lotion, Fragrance-Free  Curel Therapeutic Moisturizing Lotion, Original Formula  Eucerin Daily Replenishing Lotion  Eucerin Dry Skin Therapy Plus Alpha Hydroxy Crme  Eucerin Dry Skin Therapy Plus Alpha Hydroxy Lotion  Eucerin Original Crme  Eucerin Original Lotion  Eucerin Plus Crme Eucerin Plus Lotion  Eucerin TriLipid Replenishing Lotion  Keri Anti-Bacterial Hand Lotion  Keri Deep Conditioning Original Lotion Dry Skin Formula Softly Scented  Keri Deep Conditioning Original Lotion, Fragrance Free Sensitive Skin Formula  Keri Lotion Fast Absorbing Fragrance Free Sensitive Skin Formula  Keri Lotion Fast Absorbing Softly Scented Dry Skin Formula  Keri Original Lotion  Keri Skin Renewal Lotion Keri Silky Smooth Lotion  Keri Silky Smooth Sensitive Skin Lotion  Nivea Body Creamy Conditioning Oil  Nivea Body Extra Enriched Lotion  Nivea Body Original Lotion  Nivea Body Sheer Moisturizing Lotion Nivea Crme  Nivea Skin Firming Lotion  NutraDerm 30 Skin Lotion  NutraDerm Skin Lotion  NutraDerm  Therapeutic Skin Cream  NutraDerm Therapeutic Skin Lotion  ProShield Protective Hand Cream  Provon moisturizing lotion   PATIENT SIGNATURE_________________________________  NURSE SIGNATURE__________________________________  ________________________________________________________________________    April Terry  An incentive spirometer is a tool that can help keep your lungs clear and active. This tool measures how well you are filling your lungs with each breath. Taking long deep breaths may help reverse or decrease the chance of developing breathing (pulmonary) problems (especially infection) following: A long period of time when you are unable to move or be active. BEFORE THE PROCEDURE  If the spirometer includes an indicator to show your best effort, your nurse or respiratory therapist will set it to a desired goal. If possible, sit up straight or lean slightly forward. Try not to slouch. Hold the incentive spirometer in an upright position. INSTRUCTIONS FOR USE  Sit on the edge of your bed if possible, or sit up as far as you can in bed or on a chair. Hold the incentive spirometer in an upright position. Breathe out normally. Place the mouthpiece in your mouth and seal your lips tightly around it. Breathe in slowly and as  deeply as possible, raising the piston or the ball toward the top of the column. Hold your breath for 3-5 seconds or for as long as possible. Allow the piston or ball to fall to the bottom of the column. Remove the mouthpiece from your mouth and breathe out normally. Rest for a few seconds and repeat Steps 1 through 7 at least 10 times every 1-2 hours when you are awake. Take your time and take a few normal breaths between deep breaths. The spirometer may include an indicator to show your best effort. Use the indicator as a goal to work toward during each repetition. After each set of 10 deep breaths, practice coughing to be sure your lungs are clear. If  you have an incision (the cut made at the time of surgery), support your incision when coughing by placing a pillow or rolled up towels firmly against it. Once you are able to get out of bed, walk around indoors and cough well. You may stop using the incentive spirometer when instructed by your caregiver.  RISKS AND COMPLICATIONS Take your time so you do not get dizzy or light-headed. If you are in pain, you may need to take or ask for pain medication before doing incentive spirometry. It is harder to take a deep breath if you are having pain. AFTER USE Rest and breathe slowly and easily. It can be helpful to keep track of a log of your progress. Your caregiver can provide you with a simple table to help with this. If you are using the spirometer at home, follow these instructions: SEEK MEDICAL CARE IF:  You are having difficultly using the spirometer. You have trouble using the spirometer as often as instructed. Your pain medication is not giving enough relief while using the spirometer. You develop fever of 100.5 F (38.1 C) or higher. SEEK IMMEDIATE MEDICAL CARE IF:  You cough up bloody sputum that had not been present before. You develop fever of 102 F (38.9 C) or greater. You develop worsening pain at or near the incision site. MAKE SURE YOU:  Understand these instructions. Will watch your condition. Will get help right away if you are not doing well or get worse. Document Released: 08/05/2006 Document Revised: 06/17/2011 Document Reviewed: 10/06/2006 ExitCare Patient Information 2014 [employer]IJOU.   ________________________________________________________________________View Pre-Surgery Education Videos:  [email or web address]

## 2023-10-16 NOTE — Progress Notes (Addendum)
  Date of COVID positive in last 90 days:  No  PCP - Harlene An, NP Cardiologist - Alm Clay, MD  Cardiac clearance in Epic dated 09-09-23  Chest x-ray -  N/A EKG - 09-09-23 Epic Stress Test - 10+ years ago ECHO - 05-21-17 Epic Cardiac Cath - N/A Pacemaker/ICD device last checked:  N/A Spinal Cord Stimulator:  N/A Cardiac CT - 05-21-17 Epic  Bowel Prep -  N/A  Sleep Study - N/A CPAP -   Fasting Blood Sugar - N/A Checks Blood Sugar _____ times a day  Last dose of GLP1 agonist-  N/A GLP1 instructions:  Do not take after     Last dose of SGLT-2 inhibitors-  N/A SGLT-2 instructions:  Do not take after    Blood Thinner Instructions :N/A Last Dose:  Activity level:  Can go up a flight of stairs and perform activities of daily living without stopping and without symptoms of chest pain or shortness of breath.  Some difficulty due to knee pain.  Anesthesia review:  Palpitations followed by cardiology.  Patient states that the palpations are much improved.    Patient denies shortness of breath, fever, cough and chest pain at PAT appointment  Patient verbalized understanding of instructions that were given to them at the PAT appointment. Patient was also instructed that they will need to review over the PAT instructions again at home before surgery.

## 2023-10-17 ENCOUNTER — Other Ambulatory Visit: Payer: Self-pay

## 2023-10-17 ENCOUNTER — Encounter (HOSPITAL_COMMUNITY): Payer: Self-pay

## 2023-10-17 ENCOUNTER — Encounter (HOSPITAL_COMMUNITY)
Admission: RE | Admit: 2023-10-17 | Discharge: 2023-10-17 | Disposition: A | Discharge status: Home / Self Care | Source: Ambulatory Visit | Attending: Orthopedic Surgery | Admitting: Orthopedic Surgery

## 2023-10-17 VITALS — BP 140/65 | HR 84 | Temp 98.2°F | Resp 16 | Ht 63.5 in | Wt 156.6 lb

## 2023-10-17 DIAGNOSIS — E7800 Pure hypercholesterolemia, unspecified: Secondary | ICD-10-CM | POA: Diagnosis not present

## 2023-10-17 DIAGNOSIS — M1711 Unilateral primary osteoarthritis, right knee: Secondary | ICD-10-CM | POA: Diagnosis not present

## 2023-10-17 DIAGNOSIS — R002 Palpitations: Secondary | ICD-10-CM | POA: Insufficient documentation

## 2023-10-17 DIAGNOSIS — Z01818 Encounter for other preprocedural examination: Secondary | ICD-10-CM

## 2023-10-17 DIAGNOSIS — I728 Aneurysm of other specified arteries: Secondary | ICD-10-CM | POA: Insufficient documentation

## 2023-10-17 DIAGNOSIS — M353 Polymyalgia rheumatica: Secondary | ICD-10-CM | POA: Insufficient documentation

## 2023-10-17 DIAGNOSIS — M069 Rheumatoid arthritis, unspecified: Secondary | ICD-10-CM | POA: Diagnosis not present

## 2023-10-17 DIAGNOSIS — Z01812 Encounter for preprocedural laboratory examination: Secondary | ICD-10-CM | POA: Diagnosis not present

## 2023-10-17 DIAGNOSIS — I10 Essential (primary) hypertension: Secondary | ICD-10-CM | POA: Diagnosis not present

## 2023-10-17 DIAGNOSIS — E785 Hyperlipidemia, unspecified: Secondary | ICD-10-CM

## 2023-10-17 HISTORY — DX: Anemia, unspecified: D64.9

## 2023-10-17 LAB — CBC
HCT: 39.8 % (ref 36.0–46.0)
Hemoglobin: 13.8 g/dL (ref 12.0–15.0)
MCH: 35.1 pg — ABNORMAL HIGH (ref 26.0–34.0)
MCHC: 34.7 g/dL (ref 30.0–36.0)
MCV: 101.3 fL — ABNORMAL HIGH (ref 80.0–100.0)
Platelets: 219 K/uL (ref 150–400)
RBC: 3.93 MIL/uL (ref 3.87–5.11)
RDW: 11.9 % (ref 11.5–15.5)
WBC: 6.7 K/uL (ref 4.0–10.5)
nRBC: 0 % (ref 0.0–0.2)

## 2023-10-17 LAB — SURGICAL PCR SCREEN
MRSA, PCR: NEGATIVE
Staphylococcus aureus: NEGATIVE

## 2023-10-17 LAB — BASIC METABOLIC PANEL WITH GFR
Anion gap: 8 (ref 5–15)
BUN: 16 mg/dL (ref 8–23)
CO2: 26 mmol/L (ref 22–32)
Calcium: 9.6 mg/dL (ref 8.9–10.3)
Chloride: 106 mmol/L (ref 98–111)
Creatinine, Ser: 0.69 mg/dL (ref 0.44–1.00)
GFR, Estimated: >60 mL/min (ref >60)
Glucose, Bld: 103 mg/dL — ABNORMAL HIGH (ref 70–99)
Potassium: 3.8 mmol/L (ref 3.5–5.1)
Sodium: 140 mmol/L (ref 135–145)

## 2023-10-20 NOTE — Progress Notes (Signed)
 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  Patient is followed by cardiology for history of labile hypertension, palpitations, and splenic artery aneurysm.  Last seen by Dr. Anner on 09/09/2023.  A CTA Aorta was ordered to follow-up on a known splenic artery aneurysm however not needed prior to surgery.  Patient reported doing well at last office visit.  She was cleared for surgery:  [name] perioperative risk of a major cardiac event is 0.4% according to the Revised Cardiac Risk Index (RCRI).  Therefore, she is at low risk for perioperative complications.   Her functional capacity is good at 6.27 METs according to the Duke Activity Status Index (DASI). Recommendations: According to ACC/AHA guidelines, no further cardiovascular testing needed.  The patient may proceed to surgery at acceptable risk.   Antiplatelet and/or Anticoagulation Recommendations: Not on standing ASA Not on OAC  Seen by PCP on 07/07/2023.  All issues stable.  Cleared as moderate risk  VS: BP (!) 140/65   Pulse 84   Temp 36.8 C (Oral)   Resp 16   Ht 5' 3.5 (1.613 m)   Wt 71 kg   SpO2 99%   BMI 27.31 kg/m   PROVIDERS: Caro Harlene POUR, NP   LABS: Labs reviewed: Acceptable for surgery. (all labs ordered are listed, but only abnormal results are displayed)  Labs Reviewed  BASIC METABOLIC PANEL WITH GFR - Abnormal; Notable for the following components:      Result Value   Glucose, Bld 103 (*)    All other components within normal limits  CBC - Abnormal; Notable for the following components:   MCV 101.3 (*)    MCH 35.1 (*)     All other components within normal limits  SURGICAL PCR SCREEN     IMAGES:   EKG 09/09/23:  Normal sinus rhythm Possible Left atrial enlargement  CV:  Echo 05/21/2017:  Study Conclusions  - Left ventricle: The cavity size was normal. Wall thickness was   increased in a pattern of mild LVH. Systolic function was normal.   The estimated ejection fraction was in the range of 60% to 65%.   Wall motion was normal; there were no regional wall motion   abnormalities. Left ventricular diastolic function parameters   were normal. - Mitral valve: There was mild regurgitation. - Left atrium: The atrium was mildly dilated. - Atrial septum: No defect or patent foramen ovale was identified.  Past Medical History:  Diagnosis Date   Anemia    Essential hypertension    Fibroids    Per [facility]   H/O polymyalgia rheumatica    Was symptoms have mostly resolved after prolonged treatment with steroids   Hashimoto's thyroiditis    Per [facility]   Hyperlipidemia    Well-controlled on fish oil and low-dose Lipitor.   Lactose intolerance    Per [facility]   Osteopenia of hip, unspecified laterality    Per [facility]   Osteopenia of spine    Per [facility]   Polymyalgia rheumatica (HCC)    Per [facility]   Pure hypercholesterolemia    Per  [facility]   Rheumatoid arthritis involving both hands (HCC)    Tingling of both feet    Per [facility]   Vitamin D  deficiency    Per [facility]    Past Surgical History:  Procedure Laterality Date   ABDOMINAL HYSTERECTOMY  1984   APPENDECTOMY  1950   COLONOSCOPY  2003   Per [facility]   COLONOSCOPY  2013   COLONOSCOPY  2024   Per [facility]   PARTIAL HYSTERECTOMY  1983   Ovaries retained, Per [facility]   TRANSTHORACIC ECHOCARDIOGRAM  02/2012   Mild concentric hypertrophy.  EF 55-60%.  No regional wall motion normality.  No mitral prolapse noted.     MEDICATIONS:  acetaminophen (TYLENOL) 325 MG tablet   aspirin-acetaminophen-caffeine (EXCEDRIN MIGRAINE) 250-250-65 MG tablet   atorvastatin (LIPITOR) 10 MG tablet   Calcium Carb-Cholecalciferol 1000-800 MG-UNIT TABS   irbesartan-hydrochlorothiazide (AVALIDE) 150-12.5 MG tablet   multivitamin-lutein (OCUVITE-LUTEIN) CAPS capsule   sertraline (ZOLOFT) 50 MG tablet   Turmeric (QC TUMERIC COMPLEX PO)   No current facility-administered medications for this encounter.    Burnard CHRISTELLA Odis DEVONNA [facility] Short Stay/Anesthesiology [facility] Phone [phone] 10/20/2023 3:28 PM

## 2023-10-20 NOTE — Anesthesia Preprocedure Evaluation (Addendum)
 Anesthesia Evaluation  Patient identified by MRN, date of birth, ID band Patient awake    Reviewed: Allergy & Precautions, NPO status , Patient's Chart, lab work & pertinent test results  Airway Mallampati: II  TM Distance: >3 FB Neck ROM: Full    Dental no notable dental hx. (+) Upper Dentures, Dental Advisory Given   Pulmonary neg pulmonary ROS   Pulmonary exam normal breath sounds clear to auscultation       Cardiovascular hypertension, Pt. on medications Normal cardiovascular exam Rhythm:Regular Rate:Normal  Echo 05/21/2017:   Study Conclusions   - Left ventricle: The cavity size was normal. Wall thickness was   increased in a pattern of mild LVH. Systolic function was normal.   The estimated ejection fraction was in the range of 60% to 65%.   Wall motion was normal; there were no regional wall motion   abnormalities. Left ventricular diastolic function parameters   were normal. - Mitral valve: There was mild regurgitation. - Left atrium: The atrium was mildly dilated. - Atrial septum: No defect or patent foramen ovale was identified.    Neuro/Psych negative neurological ROS  negative psych ROS   GI/Hepatic negative GI ROS, Neg liver ROS,,,  Endo/Other  Hypothyroidism    Renal/GU negative Renal ROS  negative genitourinary   Musculoskeletal  (+) Arthritis , Rheumatoid disorders,    Abdominal   Peds  Hematology negative hematology ROS (+)   Anesthesia Other Findings PMH of HTN, palpitations, splenic artery aneurysm, hypothyroid, RA, PMR  Reproductive/Obstetrics                              Anesthesia Physical Anesthesia Plan  ASA: 2  Anesthesia Plan: Spinal and Regional   Post-op Pain Management: Regional block* and Ofirmev  IV (intra-op)*   Induction:   PONV Risk Score and Plan: 2 and Treatment may vary due to age or medical condition, Propofol  infusion, Dexamethasone  and  Ondansetron   Airway Management Planned: Natural Airway  Additional Equipment:   Intra-op Plan:   Post-operative Plan:   Informed Consent: I have reviewed the patients History and Physical, chart, labs and discussed the procedure including the risks, benefits and alternatives for the proposed anesthesia with the patient or authorized representative who has indicated his/her understanding and acceptance.     Dental advisory given  Plan Discussed with: CRNA  Anesthesia Plan Comments: (See PAT note from 7/11)         Anesthesia Quick Evaluation

## 2023-10-27 ENCOUNTER — Ambulatory Visit (HOSPITAL_COMMUNITY): Payer: Self-pay | Admitting: Medical

## 2023-10-27 ENCOUNTER — Other Ambulatory Visit: Payer: Self-pay

## 2023-10-27 ENCOUNTER — Observation Stay (HOSPITAL_COMMUNITY)
Admission: RE | Admit: 2023-10-27 | Discharge: 2023-10-28 | Disposition: A | Discharge status: Home / Self Care | Source: Ambulatory Visit | Attending: Orthopedic Surgery | Admitting: Orthopedic Surgery

## 2023-10-27 ENCOUNTER — Ambulatory Visit (HOSPITAL_BASED_OUTPATIENT_CLINIC_OR_DEPARTMENT_OTHER): Payer: Self-pay | Admitting: Anesthesiology

## 2023-10-27 ENCOUNTER — Encounter (HOSPITAL_COMMUNITY)
Admission: RE | Disposition: A | Discharge status: Home / Self Care | Payer: Self-pay | Source: Ambulatory Visit | Attending: Orthopedic Surgery

## 2023-10-27 ENCOUNTER — Encounter (HOSPITAL_COMMUNITY): Payer: Self-pay | Admitting: Orthopedic Surgery

## 2023-10-27 DIAGNOSIS — M179 Osteoarthritis of knee, unspecified: Secondary | ICD-10-CM | POA: Diagnosis present

## 2023-10-27 DIAGNOSIS — F1090 Alcohol use, unspecified, uncomplicated: Secondary | ICD-10-CM | POA: Insufficient documentation

## 2023-10-27 DIAGNOSIS — Z7982 Long term (current) use of aspirin: Secondary | ICD-10-CM | POA: Insufficient documentation

## 2023-10-27 DIAGNOSIS — G8918 Other acute postprocedural pain: Secondary | ICD-10-CM | POA: Diagnosis not present

## 2023-10-27 DIAGNOSIS — Z6827 Body mass index (BMI) 27.0-27.9, adult: Secondary | ICD-10-CM | POA: Diagnosis not present

## 2023-10-27 DIAGNOSIS — M1711 Unilateral primary osteoarthritis, right knee: Secondary | ICD-10-CM | POA: Diagnosis not present

## 2023-10-27 HISTORY — PX: TOTAL KNEE ARTHROPLASTY: SHX125

## 2023-10-27 SURGERY — ARTHROPLASTY, KNEE, TOTAL
Anesthesia: Regional | Site: Knee | Laterality: Right

## 2023-10-27 MED ORDER — AMISULPRIDE (ANTIEMETIC) 5 MG/2ML IV SOLN: 10.0000 mg | Freq: Once | INTRAVENOUS | Status: DC | PRN

## 2023-10-27 MED ORDER — FENTANYL CITRATE PF 50 MCG/ML IJ SOSY
50.0000 ug | PREFILLED_SYRINGE | INTRAMUSCULAR | Status: DC
  Administered 2023-10-27: 50 ug via INTRAVENOUS
  Filled 2023-10-27: qty 2

## 2023-10-27 MED ORDER — LACTATED RINGERS IV SOLN: INTRAVENOUS | Status: DC | PRN

## 2023-10-27 MED ORDER — SERTRALINE HCL 50 MG PO TABS
50.0000 mg | ORAL_TABLET | Freq: Every day | ORAL | Status: DC
Start: 2023-10-28 — End: 2023-10-28
  Filled 2023-10-27: qty 1

## 2023-10-27 MED ORDER — DIPHENHYDRAMINE HCL 12.5 MG/5ML PO ELIX: 12.5000 mg | ORAL_SOLUTION | ORAL | Status: DC | PRN

## 2023-10-27 MED ORDER — ROPIVACAINE HCL 5 MG/ML IJ SOLN
INTRAMUSCULAR | Status: DC | PRN
Start: 2023-10-27 — End: 2023-10-27
  Administered 2023-10-27: 20 mL via PERINEURAL

## 2023-10-27 MED ORDER — FENTANYL CITRATE (PF) 100 MCG/2ML IJ SOLN
INTRAMUSCULAR | Status: AC
  Filled 2023-10-27: qty 2

## 2023-10-27 MED ORDER — TRANEXAMIC ACID-NACL 1000-0.7 MG/100ML-% IV SOLN
1000.0000 mg | INTRAVENOUS | Status: AC
  Administered 2023-10-27: 1000 mg via INTRAVENOUS
  Filled 2023-10-27: qty 100

## 2023-10-27 MED ORDER — ONDANSETRON HCL 4 MG PO TABS: 4.0000 mg | ORAL_TABLET | Freq: Four times a day (QID) | ORAL | Status: DC | PRN

## 2023-10-27 MED ORDER — MENTHOL 3 MG MT LOZG: 1.0000 | LOZENGE | OROMUCOSAL | Status: DC | PRN

## 2023-10-27 MED ORDER — BUPIVACAINE LIPOSOME 1.3 % IJ SUSP: 20.0000 mL | Freq: Once | INTRAMUSCULAR | Status: DC

## 2023-10-27 MED ORDER — DEXAMETHASONE SODIUM PHOSPHATE 10 MG/ML IJ SOLN: 8.0000 mg | Freq: Once | INTRAMUSCULAR | Status: DC

## 2023-10-27 MED ORDER — SODIUM CHLORIDE (PF) 0.9 % IJ SOLN
INTRAMUSCULAR | Status: AC
  Filled 2023-10-27: qty 50

## 2023-10-27 MED ORDER — CEFAZOLIN SODIUM-DEXTROSE 2-4 GM/100ML-% IV SOLN
2.0000 g | INTRAVENOUS | Status: AC
  Administered 2023-10-27: 2 g via INTRAVENOUS
  Filled 2023-10-27: qty 100

## 2023-10-27 MED ORDER — METHOCARBAMOL 1000 MG/10ML IJ SOLN: 500.0000 mg | Freq: Four times a day (QID) | INTRAMUSCULAR | Status: DC | PRN

## 2023-10-27 MED ORDER — ORAL CARE MOUTH RINSE: 15.0000 mL | Freq: Once | OROMUCOSAL | Status: AC

## 2023-10-27 MED ORDER — PHENOL 1.4 % MT LIQD: 1.0000 | OROMUCOSAL | Status: DC | PRN

## 2023-10-27 MED ORDER — POLYETHYLENE GLYCOL 3350 17 G PO PACK: 17.0000 g | PACK | Freq: Every day | ORAL | Status: DC | PRN

## 2023-10-27 MED ORDER — PROPOFOL 500 MG/50ML IV EMUL
INTRAVENOUS | Status: DC | PRN
  Administered 2023-10-27: 20 mg via INTRAVENOUS
  Administered 2023-10-27: 75 ug/kg/min via INTRAVENOUS
  Administered 2023-10-27: 30 mg via INTRAVENOUS

## 2023-10-27 MED ORDER — METHOCARBAMOL 500 MG PO TABS: 500.0000 mg | ORAL_TABLET | Freq: Four times a day (QID) | ORAL | Status: DC | PRN

## 2023-10-27 MED ORDER — DOCUSATE SODIUM 100 MG PO CAPS
100.0000 mg | ORAL_CAPSULE | Freq: Two times a day (BID) | ORAL | Status: DC
  Administered 2023-10-27 – 2023-10-28 (×2): 100 mg via ORAL
  Filled 2023-10-27 (×2): qty 1

## 2023-10-27 MED ORDER — POVIDONE-IODINE 10 % EX SWAB
2.0000 | Freq: Once | CUTANEOUS | Status: DC
Start: 2023-10-27 — End: 2023-10-27

## 2023-10-27 MED ORDER — METOCLOPRAMIDE HCL 5 MG/ML IJ SOLN: 5.0000 mg | Freq: Three times a day (TID) | INTRAMUSCULAR | Status: DC | PRN

## 2023-10-27 MED ORDER — DEXAMETHASONE SODIUM PHOSPHATE 10 MG/ML IJ SOLN
10.0000 mg | Freq: Once | INTRAMUSCULAR | Status: AC
Start: 2023-10-28 — End: 2023-10-28
  Administered 2023-10-28: 10 mg via INTRAVENOUS
  Filled 2023-10-27: qty 1

## 2023-10-27 MED ORDER — ACETAMINOPHEN 325 MG PO TABS: 325.0000 mg | ORAL_TABLET | Freq: Four times a day (QID) | ORAL | Status: DC | PRN

## 2023-10-27 MED ORDER — CHLORHEXIDINE GLUCONATE 0.12 % MT SOLN
15.0000 mL | Freq: Once | OROMUCOSAL | Status: AC
  Administered 2023-10-27: 15 mL via OROMUCOSAL

## 2023-10-27 MED ORDER — STERILE WATER FOR IRRIGATION IR SOLN
Status: DC | PRN
Start: 2023-10-27 — End: 2023-10-27
  Administered 2023-10-27: 1000 mL

## 2023-10-27 MED ORDER — FENTANYL CITRATE PF 50 MCG/ML IJ SOSY: 25.0000 ug | PREFILLED_SYRINGE | INTRAMUSCULAR | Status: DC | PRN

## 2023-10-27 MED ORDER — IRBESARTAN 150 MG PO TABS: 150.0000 mg | ORAL_TABLET | Freq: Every day | ORAL | Status: DC

## 2023-10-27 MED ORDER — ONDANSETRON HCL 4 MG/2ML IJ SOLN: 4.0000 mg | Freq: Four times a day (QID) | INTRAMUSCULAR | Status: DC | PRN

## 2023-10-27 MED ORDER — PROPOFOL 1000 MG/100ML IV EMUL
INTRAVENOUS | Status: AC
  Filled 2023-10-27: qty 100

## 2023-10-27 MED ORDER — BUPIVACAINE LIPOSOME 1.3 % IJ SUSP
INTRAMUSCULAR | Status: AC
  Filled 2023-10-27: qty 20

## 2023-10-27 MED ORDER — ONDANSETRON HCL 4 MG/2ML IJ SOLN
INTRAMUSCULAR | Status: DC | PRN
  Administered 2023-10-27: 4 mg via INTRAVENOUS

## 2023-10-27 MED ORDER — ACETAMINOPHEN 10 MG/ML IV SOLN
1000.0000 mg | Freq: Four times a day (QID) | INTRAVENOUS | Status: DC
  Administered 2023-10-27: 1000 mg via INTRAVENOUS
  Filled 2023-10-27: qty 100

## 2023-10-27 MED ORDER — MORPHINE SULFATE (PF) 2 MG/ML IV SOLN: 1.0000 mg | INTRAVENOUS | Status: DC | PRN

## 2023-10-27 MED ORDER — BUPIVACAINE IN DEXTROSE 0.75-8.25 % IT SOLN
INTRATHECAL | Status: DC | PRN
Start: 2023-10-27 — End: 2023-10-27
  Administered 2023-10-27: 1.6 mL via INTRATHECAL

## 2023-10-27 MED ORDER — TRAMADOL HCL 50 MG PO TABS: 50.0000 mg | ORAL_TABLET | Freq: Four times a day (QID) | ORAL | Status: DC | PRN

## 2023-10-27 MED ORDER — ATORVASTATIN CALCIUM 10 MG PO TABS
10.0000 mg | ORAL_TABLET | Freq: Every day | ORAL | Status: DC
  Administered 2023-10-28: 10 mg via ORAL
  Filled 2023-10-27: qty 1

## 2023-10-27 MED ORDER — BISACODYL 10 MG RE SUPP: 10.0000 mg | Freq: Every day | RECTAL | Status: DC | PRN

## 2023-10-27 MED ORDER — ACETAMINOPHEN 500 MG PO TABS
1000.0000 mg | ORAL_TABLET | Freq: Four times a day (QID) | ORAL | Status: AC
  Administered 2023-10-27 – 2023-10-28 (×4): 1000 mg via ORAL
  Filled 2023-10-27 (×4): qty 2

## 2023-10-27 MED ORDER — ONDANSETRON HCL 4 MG/2ML IJ SOLN
INTRAMUSCULAR | Status: AC
  Filled 2023-10-27: qty 2

## 2023-10-27 MED ORDER — SODIUM CHLORIDE 0.9 % IR SOLN
Status: DC | PRN
Start: 2023-10-27 — End: 2023-10-27
  Administered 2023-10-27: 1000 mL

## 2023-10-27 MED ORDER — HYDROCHLOROTHIAZIDE 12.5 MG PO TABS: 12.5000 mg | ORAL_TABLET | Freq: Every day | ORAL | Status: DC

## 2023-10-27 MED ORDER — IRBESARTAN-HYDROCHLOROTHIAZIDE 150-12.5 MG PO TABS: 1.0000 | ORAL_TABLET | Freq: Every day | ORAL | Status: DC

## 2023-10-27 MED ORDER — DEXAMETHASONE SODIUM PHOSPHATE 10 MG/ML IJ SOLN
INTRAMUSCULAR | Status: DC | PRN
  Administered 2023-10-27: 10 mg via INTRAVENOUS

## 2023-10-27 MED ORDER — SODIUM CHLORIDE 0.9 % IV SOLN: INTRAVENOUS | Status: DC

## 2023-10-27 MED ORDER — CEFAZOLIN SODIUM-DEXTROSE 2-4 GM/100ML-% IV SOLN
2.0000 g | Freq: Four times a day (QID) | INTRAVENOUS | Status: AC
  Administered 2023-10-27 (×2): 2 g via INTRAVENOUS
  Filled 2023-10-27 (×2): qty 100

## 2023-10-27 MED ORDER — PHENYLEPHRINE HCL-NACL 20-0.9 MG/250ML-% IV SOLN
INTRAVENOUS | Status: DC | PRN
  Administered 2023-10-27: 30 ug/min via INTRAVENOUS

## 2023-10-27 MED ORDER — OXYCODONE HCL 5 MG PO TABS: 5.0000 mg | ORAL_TABLET | Freq: Once | ORAL | Status: DC | PRN

## 2023-10-27 MED ORDER — 0.9 % SODIUM CHLORIDE (POUR BTL) OPTIME
TOPICAL | Status: DC | PRN
  Administered 2023-10-27: 1000 mL

## 2023-10-27 MED ORDER — SODIUM CHLORIDE (PF) 0.9 % IJ SOLN
INTRAMUSCULAR | Status: DC | PRN
  Administered 2023-10-27: 80 mL

## 2023-10-27 MED ORDER — ASPIRIN 81 MG PO CHEW
81.0000 mg | CHEWABLE_TABLET | Freq: Two times a day (BID) | ORAL | Status: DC
  Administered 2023-10-28: 81 mg via ORAL
  Filled 2023-10-27: qty 1

## 2023-10-27 MED ORDER — OXYCODONE HCL 5 MG/5ML PO SOLN: 5.0000 mg | Freq: Once | ORAL | Status: DC | PRN

## 2023-10-27 MED ORDER — LACTATED RINGERS IV SOLN: INTRAVENOUS | Status: DC

## 2023-10-27 MED ORDER — METOCLOPRAMIDE HCL 5 MG PO TABS: 5.0000 mg | ORAL_TABLET | Freq: Three times a day (TID) | ORAL | Status: DC | PRN

## 2023-10-27 MED ORDER — OXYCODONE HCL 5 MG PO TABS
5.0000 mg | ORAL_TABLET | ORAL | Status: DC | PRN
  Administered 2023-10-27 – 2023-10-28 (×2): 5 mg via ORAL
  Filled 2023-10-27: qty 2
  Filled 2023-10-27 (×2): qty 1

## 2023-10-27 MED ORDER — FLEET ENEMA RE ENEM: 1.0000 | ENEMA | Freq: Once | RECTAL | Status: DC | PRN

## 2023-10-27 MED ORDER — SODIUM CHLORIDE (PF) 0.9 % IJ SOLN
INTRAMUSCULAR | Status: AC
  Filled 2023-10-27: qty 10

## 2023-10-27 MED ORDER — OXYCODONE HCL 5 MG PO TABS
10.0000 mg | ORAL_TABLET | ORAL | Status: DC | PRN
  Administered 2023-10-27: 10 mg via ORAL

## 2023-10-27 SURGICAL SUPPLY — 45 items
ATTUNE MED DOME PAT 38 KNEE (Knees) IMPLANT
ATTUNE PS FEM RT SZ 5 CEM KNEE (Femur) IMPLANT
ATTUNE PSRP INSE SZ5 7 KNEE (Insert) IMPLANT
BAG COUNTER SPONGE SURGICOUNT (BAG) IMPLANT
BAG ZIPLOCK 12X15 (MISCELLANEOUS) ×1 IMPLANT
BASE TIBIAL ROT PLAT SZ 5 KNEE (Knees) IMPLANT
BLADE SAG 18X100X1.27 (BLADE) ×1 IMPLANT
BLADE SAW SGTL 11.0X1.19X90.0M (BLADE) ×1 IMPLANT
BNDG ELASTIC 4X5.8 VLCR NS LF (GAUZE/BANDAGES/DRESSINGS) IMPLANT
BNDG ELASTIC 6INX 5YD STR LF (GAUZE/BANDAGES/DRESSINGS) ×1 IMPLANT
BOWL SMART MIX CTS (DISPOSABLE) ×1 IMPLANT
CEMENT HV SMART SET (Cement) ×2 IMPLANT
COVER SURGICAL LIGHT HANDLE (MISCELLANEOUS) ×1 IMPLANT
CUFF TRNQT CYL 34X4.125X (TOURNIQUET CUFF) ×1 IMPLANT
DERMABOND ADVANCED .7 DNX12 (GAUZE/BANDAGES/DRESSINGS) ×1 IMPLANT
DRAPE U-SHAPE 47X51 STRL (DRAPES) ×1 IMPLANT
DRESSING AQUACEL AG SP 3.5X10 (GAUZE/BANDAGES/DRESSINGS) IMPLANT
DRSG AQUACEL AG ADV 3.5X10 (GAUZE/BANDAGES/DRESSINGS) ×1 IMPLANT
DURAPREP 26ML APPLICATOR (WOUND CARE) ×1 IMPLANT
ELECT REM PT RETURN 15FT ADLT (MISCELLANEOUS) ×1 IMPLANT
GLOVE BIO SURGEON STRL SZ 6.5 (GLOVE) IMPLANT
GLOVE BIO SURGEON STRL SZ7 (GLOVE) IMPLANT
GLOVE BIO SURGEON STRL SZ8 (GLOVE) ×1 IMPLANT
GLOVE BIOGEL PI IND STRL 7.0 (GLOVE) ×1 IMPLANT
GLOVE BIOGEL PI IND STRL 8 (GLOVE) ×1 IMPLANT
GOWN STRL REUS W/ TWL LRG LVL3 (GOWN DISPOSABLE) ×1 IMPLANT
HOLDER FOLEY CATH W/STRAP (MISCELLANEOUS) ×1 IMPLANT
IMMOBILIZER KNEE 20 THIGH 36 (SOFTGOODS) ×1 IMPLANT
KIT TURNOVER KIT A (KITS) ×1 IMPLANT
MANIFOLD NEPTUNE II (INSTRUMENTS) ×1 IMPLANT
NS IRRIG 1000ML POUR BTL (IV SOLUTION) ×1 IMPLANT
PACK TOTAL KNEE CUSTOM (KITS) ×1 IMPLANT
PADDING CAST COTTON 6X4 STRL (CAST SUPPLIES) ×2 IMPLANT
PENCIL SMOKE EVACUATOR (MISCELLANEOUS) ×1 IMPLANT
PIN STEINMAN FIXATION KNEE (PIN) IMPLANT
PROTECTOR NERVE ULNAR (MISCELLANEOUS) ×1 IMPLANT
SET HNDPC FAN SPRY TIP SCT (DISPOSABLE) ×1 IMPLANT
SUT MNCRL AB 4-0 PS2 18 (SUTURE) ×1 IMPLANT
SUT VIC AB 2-0 CT1 TAPERPNT 27 (SUTURE) ×3 IMPLANT
SUTURE STRATFX 0 PDS 27 VIOLET (SUTURE) ×1 IMPLANT
TOWEL GREEN STERILE FF (TOWEL DISPOSABLE) ×1 IMPLANT
TRAY FOLEY MTR SLVR 16FR STAT (SET/KITS/TRAYS/PACK) ×1 IMPLANT
TUBE SUCTION HIGH CAP CLEAR NV (SUCTIONS) ×1 IMPLANT
WATER STERILE IRR 1000ML POUR (IV SOLUTION) ×2 IMPLANT
WRAP KNEE MAXI GEL POST OP (GAUZE/BANDAGES/DRESSINGS) ×1 IMPLANT

## 2023-10-27 NOTE — Op Note (Signed)
 OPERATIVE REPORT-TOTAL KNEE ARTHROPLASTY   Pre-operative diagnosis- Osteoarthritis  Right knee(s)  Post-operative diagnosis- Osteoarthritis Right knee(s)  Procedure-  Right  Total Knee Arthroplasty  Surgeon- Dempsey GAILS. Francessca Friis, MD  Assistant- Corean Sender, PA-C   Anesthesia-  Adductor canal block and spinal  EBL- 25 ml   Drains None  Tourniquet time-  Total Tourniquet Time Documented: Thigh (Right) - 32 minutes Total: Thigh (Right) - 32 minutes     Complications- None  Condition-PACU - hemodynamically stable.   Brief Clinical Note  April Terry is a 83 y.o. year old female with end stage OA of her right knee with progressively worsening pain and dysfunction. She has constant pain, with activity and at rest and significant functional deficits with difficulties even with ADLs. She has had extensive non-op management including analgesics, injections of cortisone and viscosupplements, and home exercise program, but remains in significant pain with significant dysfunction.Radiographs show bone on bone arthritis lateral and patellofemoral . She presents now for right Total Knee Arthroplasty.     Procedure in detail---   The patient is brought into the operating room and positioned supine on the operating table. After successful administration of  Adductor canal block and spinal,   a tourniquet is placed high on the  Right thigh(s) and the lower extremity is prepped and draped in the usual sterile fashion. Time out is performed by the operating team and then the  Right lower extremity is wrapped in Esmarch, knee flexed and the tourniquet inflated to 300 mmHg.       A midline incision is made with a ten blade through the subcutaneous tissue to the level of the extensor mechanism. A fresh blade is used to make a medial parapatellar arthrotomy. Soft tissue over the proximal medial tibia is subperiosteally elevated to the joint line with a knife and into the semimembranosus bursa with  a Cobb elevator. Soft tissue over the proximal lateral tibia is elevated with attention being paid to avoiding the patellar tendon on the tibial tubercle. The patella is everted, knee flexed 90 degrees and the ACL and PCL are removed. Findings are bone on bone lateral and patellofemoral with large global osteophytes        The drill is used to create a starting hole in the distal femur and the canal is thoroughly irrigated with sterile saline to remove the fatty contents. The 5 degree Right  valgus alignment guide is placed into the femoral canal and the distal femoral cutting block is pinned to remove 10 mm off the distal femur. Resection is made with an oscillating saw.      The tibia is subluxed forward and the menisci are removed. The extramedullary alignment guide is placed referencing proximally at the medial aspect of the tibial tubercle and distally along the second metatarsal axis and tibial crest. The block is pinned to remove 2mm off the more deficient lateral   side. Resection is made with an oscillating saw. Size 5is the most appropriate size for the tibia and the proximal tibia is prepared with the modular drill and keel punch for that size.      The femoral sizing guide is placed and size 5 is most appropriate. Rotation is marked off the epicondylar axis and confirmed by creating a rectangular flexion gap at 90 degrees. The size 5 cutting block is pinned in this rotation and the anterior, posterior and chamfer cuts are made with the oscillating saw. The intercondylar block is then placed and that cut is  made.      Trial size 5 tibial component, trial size 5 posterior stabilized femur and a 7  mm posterior stabilized rotating platform insert trial is placed. Full extension is achieved with excellent varus/valgus and anterior/posterior balance throughout full range of motion. The patella is everted and thickness measured to be 22  mm. Free hand resection is taken to 12 mm, a 38 template is placed,  lug holes are drilled, trial patella is placed, and it tracks normally. Osteophytes are removed off the posterior femur with the trial in place. All trials are removed and the cut bone surfaces prepared with pulsatile lavage. Cement is mixed and once ready for implantation, the size 5 tibial implant, size  5 posterior stabilized femoral component, and the size 38 patella are cemented in place and the patella is held with the clamp. The trial insert is placed and the knee held in full extension. The Exparel  (20 ml mixed with 60 ml saline) is injected into the extensor mechanism, posterior capsule, medial and lateral gutters and subcutaneous tissues.  All extruded cement is removed and once the cement is hard the permanent 7 mm posterior stabilized rotating platform insert is placed into the tibial tray.      The wound is copiously irrigated with saline solution and the extensor mechanism closed with # 0 Stratofix suture. The tourniquet is released for a total tourniquet time of 32  minutes. Flexion against gravity is 140 degrees and the patella tracks normally. Subcutaneous tissue is closed with 2.0 vicryl and subcuticular with running 4.0 Monocryl. The incision is cleaned and dried and steri-strips and a bulky sterile dressing are applied. The limb is placed into a knee immobilizer and the patient is awakened and transported to recovery in stable condition.      Please note that a surgical assistant was a medical necessity for this procedure in order to perform it in a safe and expeditious manner. Surgical assistant was necessary to retract the ligaments and vital neurovascular structures to prevent injury to them and also necessary for proper positioning of the limb to allow for anatomic placement of the prosthesis.   Dempsey ROCKFORD Hadriel Northup, MD    10/27/2023, 11:46 AM

## 2023-10-27 NOTE — Progress Notes (Signed)
 Orthopedic Tech Progress Note Patient Details:  April Terry 1940-12-24 [id number]  Patient ID: April Terry, female   DOB: 1940-08-23, 83 y.o.   MRN: [id number] CPM removed by PT. April Terry 10/27/2023, 4:12 PM

## 2023-10-27 NOTE — Anesthesia Postprocedure Evaluation (Signed)
 Anesthesia Post Note  Patient: April Terry  Procedure(s) Performed: ARTHROPLASTY, KNEE, TOTAL (Right: Knee)     Patient location during evaluation: PACU Anesthesia Type: Regional and Spinal Level of consciousness: oriented and awake and alert Pain management: pain level controlled Vital Signs Assessment: post-procedure vital signs reviewed and stable Respiratory status: spontaneous breathing, respiratory function stable and patient connected to nasal cannula oxygen Cardiovascular status: blood pressure returned to baseline and stable Postop Assessment: no headache, no backache and no apparent nausea or vomiting Anesthetic complications: no   No notable events documented.  Last Vitals:  Vitals:   10/27/23 1345 10/27/23 1407  BP: (!) 120/47 107/72  Pulse: 75 77  Resp: 12 16  Temp:  (!) 36.3 C  SpO2: 96% 99%    Last Pain:  Vitals:   10/27/23 1414  TempSrc:   PainSc: 6                  Jamal Pavon L Dorman Calderwood

## 2023-10-27 NOTE — Discharge Instructions (Signed)
 April Gross, MD Total Joint Specialist [facility] [place] [address], [address] [place], [place] [address] [phone]  TOTAL KNEE REPLACEMENT POSTOPERATIVE DIRECTIONS    Knee Rehabilitation, Guidelines Following Surgery  Results after knee surgery are often greatly improved when you follow the exercise, range of motion and muscle strengthening exercises prescribed by your doctor. Safety measures are also important to protect the knee from further injury. If any of these exercises cause you to have increased pain or swelling in your knee joint, decrease the amount until you are comfortable again and slowly increase them. If you have problems or questions, call your caregiver or physical therapist for advice.   BLOOD CLOT PREVENTION Take 81 mg Aspirin two times a day for three weeks following surgery. Then take an 81 mg Aspirin once a day for three weeks. Then discontinue Aspirin. You may resume your vitamins/supplements upon discharge from the hospital. Do not take any NSAIDs (Advil, Aleve, Ibuprofen, Meloxicam, etc.) for 3 weeks, while taking 81mg  Aspirin twice a day.   HOME CARE INSTRUCTIONS  Remove items at home which could result in a fall. This includes throw rugs or furniture in walking pathways.  ICE to the affected knee as much as tolerated. Icing helps control swelling. If the swelling is well controlled you will be more comfortable and rehab easier. Continue to use ice on the knee for pain and swelling from surgery. You may notice swelling that will progress down to the foot and ankle. This is normal after surgery. Elevate the leg when you are not up walking on it.    Continue to use the breathing machine which will help keep your temperature down. It is common for your temperature to cycle up and down following surgery, especially at night when you are not up moving around and exerting yourself. The breathing machine keeps your lungs expanded and your temperature  down. Do not place pillow under the operative knee, focus on keeping the knee straight while resting  DIET You may resume your previous home diet once you are discharged from the hospital.  DRESSING / WOUND CARE / SHOWERING Keep your bulky bandage on for 2 days. On the third post-operative day you may remove the Ace bandage and gauze. There is a waterproof adhesive bandage on your skin which will stay in place until your first follow-up appointment. Once you remove this you will not need to place another bandage You may begin showering 3 days following surgery, but do not submerge the incision under water.  ACTIVITY For the first 5 days, the key is rest and control of pain and swelling Do your home exercises twice a day starting on post-operative day 3. On the days you go to physical therapy, just do the home exercises once that day. You should rest, ice and elevate the leg for 50 minutes out of every hour. Get up and walk/stretch for 10 minutes per hour. After 5 days you can increase your activity slowly as tolerated. Walk with your walker as instructed. Use the walker until you are comfortable transitioning to a cane. Walk with the cane in the opposite hand of the operative leg. You may discontinue the cane once you are comfortable and walking steadily. Avoid periods of inactivity such as sitting longer than an hour when not asleep. This helps prevent blood clots.  You may discontinue the knee immobilizer once you are able to perform a straight leg raise while lying down. You may resume a sexual relationship in  one month or when given the OK by your doctor.  You may return to work once you are cleared by your doctor.  Do not drive a car for 6 weeks or until released by your surgeon.  Do not drive while taking narcotics.  TED HOSE STOCKINGS Wear the elastic stockings on both legs for three weeks following surgery during the day. You may remove them at night for sleeping.  WEIGHT  BEARING Weight bearing as tolerated with assist device (walker, cane, etc) as directed, use it as long as suggested by your surgeon or therapist, typically at least 4-6 weeks.  POSTOPERATIVE CONSTIPATION PROTOCOL Constipation - defined medically as fewer than three stools per week and severe constipation as less than one stool per week.  One of the most common issues patients have following surgery is constipation.  Even if you have a regular bowel pattern at home, your normal regimen is likely to be disrupted due to multiple reasons following surgery.  Combination of anesthesia, postoperative narcotics, change in appetite and fluid intake all can affect your bowels.  In order to avoid complications following surgery, here are some recommendations in order to help you during your recovery period.  Colace (docusate) - Pick up an over-the-counter form of Colace or another stool softener and take twice a day as long as you are requiring postoperative pain medications.  Take with a full glass of water daily.  If you experience loose stools or diarrhea, hold the colace until you stool forms back up. If your symptoms do not get better within 1 week or if they get worse, check with your doctor. Dulcolax (bisacodyl) - Pick up over-the-counter and take as directed by the product packaging as needed to assist with the movement of your bowels.  Take with a full glass of water.  Use this product as needed if not relieved by Colace only.  MiraLax (polyethylene glycol) - Pick up over-the-counter to have on hand. MiraLax is a solution that will increase the amount of water in your bowels to assist with bowel movements.  Take as directed and can mix with a glass of water, juice, soda, coffee, or tea. Take if you go more than two days without a movement. Do not use MiraLax more than once per day. Call your doctor if you are still constipated or irregular after using this medication for 7 days in a row.  If you continue  to have problems with postoperative constipation, please contact the office for further assistance and recommendations.  If you experience "the worst abdominal pain ever" or develop nausea or vomiting, please contact the office immediatly for further recommendations for treatment.  ITCHING If you experience itching with your medications, try taking only a single pain pill, or even half a pain pill at a time.  You can also use Benadryl over the counter for itching or also to help with sleep.   MEDICATIONS See your medication summary on the "After Visit Summary" that the nursing staff will review with you prior to discharge.  You may have some home medications which will be placed on hold until you complete the course of blood thinner medication.  It is important for you to complete the blood thinner medication as prescribed by your surgeon.  Continue your approved medications as instructed at time of discharge.  PRECAUTIONS If you experience chest pain or shortness of breath - call 911 immediately for transfer to the hospital emergency department.  If you develop a fever greater that  101 F, purulent drainage from wound, increased redness or drainage from wound, foul odor from the wound/dressing, or calf pain - CONTACT YOUR SURGEON.                                                   FOLLOW-UP APPOINTMENTS Make sure you keep all of your appointments after your operation with your surgeon and caregivers. You should call the office at the above phone number and make an appointment for approximately two weeks after the date of your surgery or on the date instructed by your surgeon outlined in the "After Visit Summary".  RANGE OF MOTION AND STRENGTHENING EXERCISES  Rehabilitation of the knee is important following a knee injury or an operation. After just a few days of immobilization, the muscles of the thigh which control the knee become weakened and shrink (atrophy). Knee exercises are designed to build up  the tone and strength of the thigh muscles and to improve knee motion. Often times heat used for twenty to thirty minutes before working out will loosen up your tissues and help with improving the range of motion but do not use heat for the first two weeks following surgery. These exercises can be done on a training (exercise) mat, on the floor, on a table or on a bed. Use what ever works the best and is most comfortable for you Knee exercises include:  Leg Lifts - While your knee is still immobilized in a splint or cast, you can do straight leg raises. Lift the leg to 60 degrees, hold for 3 sec, and slowly lower the leg. Repeat 10-20 times 2-3 times daily. Perform this exercise against resistance later as your knee gets better.  Quad and Hamstring Sets - Tighten up the muscle on the front of the thigh (Quad) and hold for 5-10 sec. Repeat this 10-20 times hourly. Hamstring sets are done by pushing the foot backward against an object and holding for 5-10 sec. Repeat as with quad sets.  Leg Slides: Lying on your back, slowly slide your foot toward your buttocks, bending your knee up off the floor (only go as far as is comfortable). Then slowly slide your foot back down until your leg is flat on the floor again. Angel Wings: Lying on your back spread your legs to the side as far apart as you can without causing discomfort.  A rehabilitation program following serious knee injuries can speed recovery and prevent re-injury in the future due to weakened muscles. Contact your doctor or a physical therapist for more information on knee rehabilitation.   POST-OPERATIVE OPIOID TAPER INSTRUCTIONS: It is important to wean off of your opioid medication as soon as possible. If you do not need pain medication after your surgery it is ok to stop day one. Opioids include: Codeine, Hydrocodone(Norco, Vicodin), Oxycodone(Percocet, oxycontin) and hydromorphone amongst others.  Long term and even short term use of opiods can  cause: Increased pain response Dependence Constipation Depression Respiratory depression And more.  Withdrawal symptoms can include Flu like symptoms Nausea, vomiting And more Techniques to manage these symptoms Hydrate well Eat regular healthy meals Stay active Use relaxation techniques(deep breathing, meditating, yoga) Do Not substitute Alcohol to help with tapering If you have been on opioids for less than two weeks and do not have pain than it is ok to stop all together.  Plan to wean off of opioids This plan should start within one week post op of your joint replacement. Maintain the same interval or time between taking each dose and first decrease the dose.  Cut the total daily intake of opioids by one tablet each day Next start to increase the time between doses. The last dose that should be eliminated is the evening dose.   IF YOU ARE TRANSFERRED TO A SKILLED REHAB FACILITY If the patient is transferred to a skilled rehab facility following release from the hospital, a list of the current medications will be sent to the facility for the patient to continue.  When discharged from the skilled rehab facility, please have the facility set up the patient's Home Health Physical Therapy prior to being released. Also, the skilled facility will be responsible for providing the patient with their medications at time of release from the facility to include their pain medication, the muscle relaxants, and their blood thinner medication. If the patient is still at the rehab facility at time of the two week follow up appointment, the skilled rehab facility will also need to assist the patient in arranging follow up appointment in our office and any transportation needs.  MAKE SURE YOU:  Understand these instructions.  Get help right away if you are not doing well or get worse.   DENTAL ANTIBIOTICS:  In most cases prophylactic antibiotics for Dental procdeures after total joint surgery are  not necessary.  Exceptions are as follows:  1. History of prior total joint infection  2. Severely immunocompromised (Organ Transplant, cancer chemotherapy, Rheumatoid biologic meds such as Humera)  3. Poorly controlled diabetes (A1C &gt; 8.0, blood glucose over 200)  If you have one of these conditions, contact your surgeon for an antibiotic prescription, prior to your dental procedure.    Pick up stool softner and laxative for home use following surgery while on pain medications. Do not submerge incision under water. Please use good hand washing techniques while changing dressing each day. May shower starting three days after surgery. Please use a clean towel to pat the incision dry following showers. Continue to use ice for pain and swelling after surgery. Do not use any lotions or creams on the incision until instructed by your surgeon.

## 2023-10-27 NOTE — Anesthesia Procedure Notes (Signed)
 Anesthesia Regional Block: Adductor canal block   Pre-Anesthetic Checklist: , timeout performed,  Correct Patient, Correct Site, Correct Laterality,  Correct Procedure, Correct Position, site marked,  Risks and benefits discussed,  Pre-op evaluation,  At surgeon's request and post-op pain management  Laterality: Right  Prep: Maximum Sterile Barrier Precautions used, chloraprep       Needles:  Injection technique: Single-shot  Needle Type: Echogenic Stimulator Needle     Needle Length: 9cm  Needle Gauge: 21     Additional Needles:   Procedures:,,,, ultrasound used (permanent image in chart),,    Narrative:  Start time: 10/27/2023 8:57 AM End time: 10/27/2023 8:59 AM Injection made incrementally with aspirations every 5 mL. Anesthesiologist: Niels Marien CROME, MD

## 2023-10-27 NOTE — Transfer of Care (Signed)
 Immediate Anesthesia Transfer of Care Note  Patient: April Terry  Procedure(s) Performed: ARTHROPLASTY, KNEE, TOTAL (Right: Knee)  Patient Location: PACU  Anesthesia Type:Spinal and MAC combined with regional for post-op pain  Level of Consciousness: awake, alert , and oriented  Airway & Oxygen Therapy: Patient Spontanous Breathing and Patient connected to face mask oxygen  Post-op Assessment: Report given to RN and Post -op Vital signs reviewed and stable  Post vital signs: Reviewed and stable  Last Vitals:  Vitals Value Taken Time  BP 138/89 10/27/23 12:07  Temp    Pulse 72 10/27/23 12:10  Resp 16 10/27/23 12:10  SpO2 100 % 10/27/23 12:10  Vitals shown include unfiled device data.  Last Pain:  Vitals:   10/27/23 0910  TempSrc:   PainSc: 0-No pain         Complications: No notable events documented.

## 2023-10-27 NOTE — Plan of Care (Signed)
 ?  Problem: Clinical Measurements: ?Goal: Will remain free from infection ?Outcome: Progressing ?  ?

## 2023-10-27 NOTE — Progress Notes (Signed)
 Orthopedic Tech Progress Note Patient Details:  April Terry 21-Oct-1940 [id number]  CPM Right Knee CPM Right Knee: On Right Knee Flexion (Degrees): 40 Right Knee Extension (Degrees): 10  Post Interventions Patient Tolerated: Well Instructions Provided: Care of device  [name] 10/27/2023, 12:28 PM

## 2023-10-27 NOTE — Care Plan (Signed)
 Ortho Bundle Case Management Note  Patient Details  Name: April Terry MRN: [id number] Date of Birth: Nov 25, 1940  RT TKA on 10/27/23  DCP: Home with husband and daughter DME: No needs, has RW and cane PT: EO                   DME Arranged:  N/A DME Agency:  NA  HH Arranged:    HH Agency:     Additional Comments: Please contact me with any questions of if this plan should need to change.  Burnard Dross, Case Manager [facility] [phone]  Ext. [phone]   10/27/2023, 9:04 AM

## 2023-10-27 NOTE — Anesthesia Procedure Notes (Signed)
 Spinal  Patient location during procedure: OR Start time: 10/27/2023 10:40 AM End time: 10/27/2023 10:49 AM Reason for block: surgical anesthesia Staffing Performed: anesthesiologist  Anesthesiologist: Niels Marien CROME, MD Performed by: Niels Marien CROME, MD Authorized by: Niels Marien CROME, MD   Preanesthetic Checklist Completed: patient identified, IV checked, risks and benefits discussed, surgical consent, monitors and equipment checked, pre-op evaluation and timeout performed Spinal Block Patient position: sitting Prep: DuraPrep and site prepped and draped Patient monitoring: cardiac monitor, continuous pulse ox and blood pressure Approach: left paramedian Location: L3-4 Injection technique: single-shot Needle Needle type: Quincke  Needle gauge: 22 G Needle length: 9 cm Assessment Sensory level: T6 Events: CSF return Additional Notes Functioning IV was confirmed and monitors were applied. Sterile prep and drape, including hand hygiene and sterile gloves were used. The patient was positioned and the spine was prepped. The skin was anesthetized with lidocaine.  Free flow of clear CSF was obtained prior to injecting local anesthetic into the CSF.  The spinal needle aspirated freely following injection.  The needle was carefully withdrawn.  The patient tolerated the procedure well.   Difficult spinal placement with attempts at L2/3, L3/4, and L4/5. Successful spinal placement at L3/4 left paramedian with 22G Quincke.

## 2023-10-27 NOTE — Evaluation (Signed)
 Physical Therapy Evaluation Patient Details Name: April Terry MRN: [id number] DOB: [birth year] Today's Date: 10/27/2023  History of Present Illness  83 y.o. female admited 10/27/23 for R TKA. PMH: HTN, polymyalgia rheumatica, RA.  Clinical Impression  Pt is s/p TKA resulting in the deficits listed below (see PT Problem List). Mod/max assist for stand pivot transfer to recliner as pt's RLE buckled significantly despite pt having sensation intact to light touch from toes to hip on RLE, good quadriceps contraction, and could perform a straight leg raise independently. I suspect spinal is not fully worn off and this issue will resolve when it does. Messaged RN to request order for knee immobilizer for safe transfer back to bed.  Initiated TKA HEP. Good progress expected, pt puts forth good effort.  Pt will benefit from acute skilled PT to increase their independence and safety with mobility to allow discharge.          If plan is discharge home, recommend the following: A lot of help with walking and/or transfers;A little help with bathing/dressing/bathroom;Assistance with cooking/housework;Assist for transportation;Help with stairs or ramp for entrance   Can travel by private vehicle        Equipment Recommendations None recommended by PT  Recommendations for Other Services       Functional Status Assessment Patient has had a recent decline in their functional status and demonstrates the ability to make significant improvements in function in a reasonable and predictable amount of time.     Precautions / Restrictions Precautions Precautions: Fall;Knee Recall of Precautions/Restrictions: Intact Precaution/Restrictions Comments: no pillow under knee Restrictions Weight Bearing Restrictions Per Provider Order: No Other Position/Activity Restrictions: WBAT      Mobility  Bed Mobility Overal bed mobility: Modified Independent             General bed mobility comments: HOB up, used  rail    Transfers Overall transfer level: Needs assistance Equipment used: Rolling walker (2 wheels) Transfers: Sit to/from Stand, Bed to chair/wheelchair/BSC Sit to Stand: Contact guard assist   Step pivot transfers: Max assist, Mod assist       General transfer comment: VCs hand placement; pt able to weight shift to R in standing, then with step pivot to recliner with RW RLE buckled requiring mod/max assist to get to chair, suspect spinal isn't worn off fully despite pt has +SLR, good quad set, sensation intact to light touch from toes to hip.    Ambulation/Gait                  Stairs            Wheelchair Mobility     Tilt Bed    Modified Rankin (Stroke Patients Only)       Balance                                             Pertinent Vitals/Pain Pain Assessment Pain Assessment: No/denies pain    Home Living Family/patient expects to be discharged to:: Private residence Living Arrangements: Spouse/significant other Available Help at Discharge: Family;Available 24 hours/day   Home Access: Stairs to enter   Entrance Stairs-Number of Steps: 1   Home Layout: One level Home Equipment: Agricultural consultant (2 wheels);Cane - single point;Crutches;Shower seat - built in;Grab bars - tub/shower Additional Comments: spouse at home, daughter staying 3 days    Prior Function Prior Level  of Function : Independent/Modified Independent;Driving             Mobility Comments: walks with SPC, no falls in past 6 months ADLs Comments: independent     Extremity/Trunk Assessment   Upper Extremity Assessment Upper Extremity Assessment: Overall WFL for tasks assessed    Lower Extremity Assessment Lower Extremity Assessment: RLE deficits/detail RLE Deficits / Details: SLR 3/5, good quad set contraction; pt reports sensation intact to light touch from toes up to hip RLE Sensation: history of peripheral neuropathy;WNL RLE Coordination:  decreased gross motor;decreased fine motor    Cervical / Trunk Assessment Cervical / Trunk Assessment: Normal  Communication   Communication Communication: No apparent difficulties    Cognition Arousal: Alert Behavior During Therapy: WFL for tasks assessed/performed   PT - Cognitive impairments: No apparent impairments                         Following commands: Intact       Cueing Cueing Techniques: Verbal cues     General Comments      Exercises Total Joint Exercises Ankle Circles/Pumps: AROM, Both, 20 reps, Supine Quad Sets: AROM, Right, 5 reps, Supine Heel Slides: AAROM, Right, 5 reps, Supine Straight Leg Raises: AROM, Right, 5 reps, Supine   Assessment/Plan    PT Assessment Patient needs continued PT services  PT Problem List Decreased strength;Decreased activity tolerance;Decreased range of motion;Decreased mobility;Pain;Decreased balance       PT Treatment Interventions Gait training;Therapeutic activities;DME instruction;Therapeutic exercise;Balance training;Patient/family education    PT Goals (Current goals can be found in the Care Plan section)  Acute Rehab PT Goals Patient Stated Goal: [facility] PT Goal Formulation: With patient Time For Goal Achievement: 11/10/23 Potential to Achieve Goals: Good    Frequency 7X/week     Co-evaluation               AM-PAC PT 6 Clicks Mobility  Outcome Measure Help needed turning from your back to your side while in a flat bed without using bedrails?: None Help needed moving from lying on your back to sitting on the side of a flat bed without using bedrails?: A Little Help needed moving to and from a bed to a chair (including a wheelchair)?: A Little Help needed standing up from a chair using your arms (e.g., wheelchair or bedside chair)?: A Little Help needed to walk in hospital room?: Total Help needed climbing 3-5 steps with a railing? : Total 6 Click Score: 15     End of Session Equipment Utilized During Treatment: Gait belt Activity Tolerance: Patient tolerated treatment well Patient left: in chair;with call bell/phone within reach;with chair alarm set Nurse Communication: Mobility status PT Visit Diagnosis: Unsteadiness on feet (R26.81);Muscle weakness (generalized) (M62.81);Difficulty in walking, not elsewhere classified (R26.2)    Time: 8499-8470 PT Time Calculation (min) (ACUTE ONLY): 29 min   Charges:   PT Evaluation $PT Eval Moderate Complexity: 1 Mod PT Treatments $Therapeutic Activity: 8-22 mins PT General Charges $$ ACUTE PT VISIT: 1 Visit        Sylvan Delon Copp PT 10/27/2023  Acute Rehabilitation Services  Office [phone]

## 2023-10-27 NOTE — Interval H&P Note (Signed)
 History and Physical Interval Note:  10/27/2023 8:51 AM  April Terry  has presented today for surgery, with the diagnosis of right knee osteoarthritis.  The various methods of treatment have been discussed with the patient and family. After consideration of risks, benefits and other options for treatment, the patient has consented to  Procedure(s): ARTHROPLASTY, KNEE, TOTAL (Right) as a surgical intervention.  The patient's history has been reviewed, patient examined, no change in status, stable for surgery.  I have reviewed the patient's chart and labs.  Questions were answered to the patient's satisfaction.     Dempsey [name]

## 2023-10-28 ENCOUNTER — Other Ambulatory Visit (HOSPITAL_COMMUNITY): Payer: Self-pay

## 2023-10-28 ENCOUNTER — Encounter (HOSPITAL_COMMUNITY): Payer: Self-pay | Admitting: Orthopedic Surgery

## 2023-10-28 DIAGNOSIS — Z7982 Long term (current) use of aspirin: Secondary | ICD-10-CM | POA: Diagnosis not present

## 2023-10-28 DIAGNOSIS — Z6827 Body mass index (BMI) 27.0-27.9, adult: Secondary | ICD-10-CM | POA: Diagnosis not present

## 2023-10-28 DIAGNOSIS — M1711 Unilateral primary osteoarthritis, right knee: Secondary | ICD-10-CM | POA: Diagnosis not present

## 2023-10-28 DIAGNOSIS — F1090 Alcohol use, unspecified, uncomplicated: Secondary | ICD-10-CM | POA: Diagnosis not present

## 2023-10-28 LAB — CBC
HCT: 30.9 % — ABNORMAL LOW (ref 36.0–46.0)
Hemoglobin: 10.2 g/dL — ABNORMAL LOW (ref 12.0–15.0)
MCH: 34.2 pg — ABNORMAL HIGH (ref 26.0–34.0)
MCHC: 33 g/dL (ref 30.0–36.0)
MCV: 103.7 fL — ABNORMAL HIGH (ref 80.0–100.0)
Platelets: 178 K/uL (ref 150–400)
RBC: 2.98 MIL/uL — ABNORMAL LOW (ref 3.87–5.11)
RDW: 12 % (ref 11.5–15.5)
WBC: 12.4 K/uL — ABNORMAL HIGH (ref 4.0–10.5)
nRBC: 0 % (ref 0.0–0.2)

## 2023-10-28 LAB — BASIC METABOLIC PANEL WITH GFR
Anion gap: 5 (ref 5–15)
BUN: 10 mg/dL (ref 8–23)
CO2: 25 mmol/L (ref 22–32)
Calcium: 7.4 mg/dL — ABNORMAL LOW (ref 8.9–10.3)
Chloride: 110 mmol/L (ref 98–111)
Creatinine, Ser: 0.52 mg/dL (ref 0.44–1.00)
GFR, Estimated: >60 mL/min (ref >60)
Glucose, Bld: 137 mg/dL — ABNORMAL HIGH (ref 70–99)
Potassium: 3.2 mmol/L — ABNORMAL LOW (ref 3.5–5.1)
Sodium: 140 mmol/L (ref 135–145)

## 2023-10-28 MED ORDER — ONDANSETRON HCL 4 MG PO TABS
4.0000 mg | ORAL_TABLET | Freq: Four times a day (QID) | ORAL | 0 refills | Status: DC | PRN
  Filled 2023-10-28: qty 20, 5d supply, fill #0

## 2023-10-28 MED ORDER — METHOCARBAMOL 500 MG PO TABS
500.0000 mg | ORAL_TABLET | Freq: Four times a day (QID) | ORAL | 0 refills | Status: DC | PRN
  Filled 2023-10-28: qty 40, 10d supply, fill #0

## 2023-10-28 MED ORDER — OXYCODONE HCL 5 MG PO TABS
5.0000 mg | ORAL_TABLET | Freq: Four times a day (QID) | ORAL | 0 refills | Status: DC | PRN
  Filled 2023-10-28: qty 42, 6d supply, fill #0

## 2023-10-28 MED ORDER — POTASSIUM CHLORIDE CRYS ER 20 MEQ PO TBCR
40.0000 meq | EXTENDED_RELEASE_TABLET | Freq: Once | ORAL | Status: AC
  Administered 2023-10-28: 40 meq via ORAL
  Filled 2023-10-28: qty 2

## 2023-10-28 MED ORDER — SODIUM CHLORIDE 0.9 % IV BOLUS
250.0000 mL | Freq: Once | INTRAVENOUS | Status: AC
  Administered 2023-10-28: 250 mL via INTRAVENOUS

## 2023-10-28 MED ORDER — ASPIRIN 81 MG PO CHEW
81.0000 mg | CHEWABLE_TABLET | Freq: Two times a day (BID) | ORAL | 0 refills | Status: AC
Start: 2023-10-28 — End: 2023-11-18
  Filled 2023-10-28: qty 42, 21d supply, fill #0

## 2023-10-28 MED ORDER — TRAMADOL HCL 50 MG PO TABS
50.0000 mg | ORAL_TABLET | Freq: Four times a day (QID) | ORAL | 0 refills | Status: DC | PRN
  Filled 2023-10-28: qty 40, 5d supply, fill #0

## 2023-10-28 NOTE — Progress Notes (Signed)
 Subjective: 1 Day Post-Op Procedure(s) (LRB): ARTHROPLASTY, KNEE, TOTAL (Right) Patient seen in rounds by Dr. Melodi. Patient is well, and has had no acute complaints or problems. Denies SOB or chest pain. Denies calf pain. Foley cath removed this AM. Patient reports pain as moderate. Worked with physical therapy yesterday but limited as likely spinal had not fully worn off. We will continue physical therapy today.  Objective: Vital signs in last 24 hours: Temp:  [97.4 F (36.3 C)-98.1 F (36.7 C)] 97.9 F (36.6 C) (07/22 0730) Pulse Rate:  [64-82] 71 (07/22 0730) Resp:  [10-20] 16 (07/22 0730) BP: (88-140)/(41-89) 112/45 (07/22 0730) SpO2:  [91 %-100 %] 98 % (07/22 0730) Weight:  [71 kg] 71 kg (07/21 0839)  Intake/Output from previous day:  Intake/Output Summary (Last 24 hours) at 10/28/2023 0818 Last data filed at 10/28/2023 0651 Gross per 24 hour  Intake 2868.5 ml  Output 2395 ml  Net 473.5 ml     Intake/Output this shift: No intake/output data recorded.  Labs: Recent Labs    10/28/23 0339  HGB 10.2*   Recent Labs    10/28/23 0339  WBC 12.4*  RBC 2.98*  HCT 30.9*  PLT 178   Recent Labs    10/28/23 0339  NA 140  K 3.2*  CL 110  CO2 25  BUN 10  CREATININE 0.52  GLUCOSE 137*  CALCIUM  7.4*   No results for input(s): LABPT, INR in the last 72 hours.  Exam: General - Patient is Alert and Oriented Extremity - Neurologically intact Neurovascular intact Sensation intact distally Dorsiflexion/Plantar flexion intact Dressing - dressing C/D/I Motor Function - intact, moving foot and toes well on exam.  Past Medical History:  Diagnosis Date   Anemia    Essential hypertension    Fibroids    Per [facility]   H/O polymyalgia rheumatica    Was symptoms have mostly resolved after prolonged treatment with steroids   Hashimoto's thyroiditis    Per [facility]   Hyperlipidemia    Well-controlled on fish oil and low-dose Lipitor.    Lactose intolerance    Per [facility]   Osteopenia of hip, unspecified laterality    Per [facility]   Osteopenia of spine    Per [facility]   Polymyalgia rheumatica (HCC)    Per [facility]   Pure hypercholesterolemia    Per [facility]   Rheumatoid arthritis involving both hands (HCC)    Tingling of both feet    Per [facility]   Vitamin D  deficiency    Per [facility]    Assessment/Plan: 1 Day Post-Op Procedure(s) (LRB): ARTHROPLASTY, KNEE, TOTAL (Right) Principal Problem:   OA (osteoarthritis) of knee Active Problems:   Primary osteoarthritis of right knee  Estimated body mass index is 27.29 kg/m as calculated from the following:   Height as of this encounter: 5' 3.5 (1.613 m).   Weight as of this encounter: 71 kg. Advance diet Up with therapy D/C IV fluids  Patient's anticipated LOS is less than 2 midnights, meeting these requirements: - Lives within 1 hour of care - Has a competent adult at home to recover with post-op - NO history of  - Chronic pain requiring opiods  - Diabetes  - Coronary Artery Disease  - Heart failure  - Heart attack  - Stroke  - DVT/VTE  - Cardiac arrhythmia  - Respiratory Failure/COPD  - Renal failure  - Anemia  - Advanced Liver disease  DVT Prophylaxis - Aspirin   Weight bearing as tolerated.  Continue physical therapy. Expected discharge home today pending progress and if meeting patient goals. Scheduled for OPPT at [facility]. Follow-up in clinic in 2 weeks.  The PDMP database was reviewed today prior to any opioid medications being prescribed to this patient.  R. Zelda Kobs, PA-C Orthopedic Surgery 10/28/2023, 8:18 AM

## 2023-10-28 NOTE — Plan of Care (Signed)
  Problem: Education: Goal: Knowledge of General Education information will improve Description: Including pain rating scale, medication(s)/side effects and non-pharmacologic comfort measures Outcome: Progressing   Problem: Health Behavior/Discharge Planning: Goal: Ability to manage health-related needs will improve Outcome: Progressing   Problem: Clinical Measurements: Goal: Ability to maintain clinical measurements within normal limits will improve Outcome: Progressing Goal: Will remain free from infection Outcome: Progressing Goal: Diagnostic test results will improve Outcome: Progressing Goal: Respiratory complications will improve Outcome: Progressing Goal: Cardiovascular complication will be avoided Outcome: Progressing   Problem: Activity: Goal: Risk for activity intolerance will decrease Outcome: Progressing   Problem: Nutrition: Goal: Adequate nutrition will be maintained Outcome: Completed/Met   Problem: Coping: Goal: Level of anxiety will decrease Outcome: Progressing   Problem: Elimination: Goal: Will not experience complications related to bowel motility Outcome: Progressing Goal: Will not experience complications related to urinary retention Outcome: Progressing   Problem: Pain Managment: Goal: General experience of comfort will improve and/or be controlled Outcome: Progressing   Problem: Safety: Goal: Ability to remain free from injury will improve Outcome: Progressing   Problem: Skin Integrity: Goal: Risk for impaired skin integrity will decrease Outcome: Adequate for Discharge   Problem: Education: Goal: Knowledge of the prescribed therapeutic regimen will improve Outcome: Progressing Goal: Individualized Educational Video(s) Outcome: Completed/Met   Problem: Activity: Goal: Ability to avoid complications of mobility impairment will improve Outcome: Progressing Goal: Range of joint motion will improve Outcome: Adequate for Discharge    Problem: Clinical Measurements: Goal: Postoperative complications will be avoided or minimized Outcome: Progressing   Problem: Pain Management: Goal: Pain level will decrease with appropriate interventions Outcome: Progressing   Problem: Skin Integrity: Goal: Will show signs of wound healing Outcome: Progressing

## 2023-10-28 NOTE — TOC Transition Note (Signed)
 Transition of Care [facility]) - Discharge Note   Patient Details  Name: April Terry MRN: [id number] Date of Birth: 1940-04-27  Transition of [facility]) CM/SW Contact:  Alfonse JONELLE Rex, RN Phone Number: 10/28/2023, 11:16 AM   Clinical Narrative:  Met with patient to review dc therapy and home equipment needs, pt confirmed OPPT EO, has RW. MOON completed. No TOC needs.      Final next level of care: OP Rehab Barriers to Discharge: No Barriers Identified   Patient Goals and CMS Choice Patient states their goals for this hospitalization and ongoing recovery are:: return home          Discharge Placement                       Discharge Plan and Services Additional resources added to the After Visit Summary for                  DME Arranged: N/A DME Agency: NA                  Social Drivers of Health (SDOH) Interventions SDOH Screenings   Food Insecurity: No Food Insecurity (10/27/2023)  Housing: Low Risk  (10/27/2023)  Transportation Needs: No Transportation Needs (10/27/2023)  Utilities: Not At Risk (10/27/2023)  Alcohol Screen: Low Risk  (01/09/2023)  Depression (PHQ2-9): Low Risk  (01/13/2023)  Financial Resource Strain: Low Risk  (07/04/2023)  Physical Activity: Inactive (07/04/2023)  Social Connections: Socially Integrated (10/27/2023)  Stress: No Stress Concern Present (07/04/2023)  Tobacco Use: Low Risk  (10/27/2023)     Readmission Risk Interventions     No data to display

## 2023-10-28 NOTE — Progress Notes (Signed)
 Physical Therapy Treatment Patient Details Name: April Terry MRN: [id number] DOB: 1940-08-04 Today's Date: 10/28/2023   History of Present Illness 83 y.o. female admited 10/27/23 for R TKA. PMH: HTN, polymyalgia rheumatica, RA.    PT Comments  POD # 1 pm session Family Education with Daughter. General bed mobility comments: demonstarted and Instructed how to use her strap to self assist LE OOB if needed.  General transfer comment: Had Daughter hands on assist Pt using safety belt with Instruction on safe handling. General Gait Details: Had Daughter hands on assist Pt amb with walker using safety belt.  Instructed on activity level. General stair comments: Practiced up/down ONE step with Daughter hand on using safety belt with Instructions on proper tech and sequencing. Then returned to room to perform some TE's following HEP handout.  Instructed on proper tech, freq as well as use of ICE.   Addressed all mobility questions, discussed appropriate activity, educated on use of ICE.  Pt ready for D/C to home.    If plan is discharge home, recommend the following: A lot of help with walking and/or transfers;A little help with bathing/dressing/bathroom;Assistance with cooking/housework;Assist for transportation;Help with stairs or ramp for entrance   Can travel by private vehicle        Equipment Recommendations  None recommended by PT    Recommendations for Other Services       Precautions / Restrictions Precautions Precautions: Fall;Knee Precaution/Restrictions Comments: no pillow under knee Restrictions Weight Bearing Restrictions Per Provider Order: No Other Position/Activity Restrictions: WBAT     Mobility  Bed Mobility Overal bed mobility: Modified Independent             General bed mobility comments: demonstarted and Instructed how to use her strap to self assist LE OOB if needed.    Transfers Overall transfer level: Needs assistance Equipment used: Rolling  walker (2 wheels) Transfers: Sit to/from Stand Sit to Stand: Supervision, Contact guard assist           General transfer comment: Had Daughter hands on assist Pt using safety belt with Instruction on safe handling.    Ambulation/Gait Ambulation/Gait assistance: Supervision, Contact guard assist Gait Distance (Feet): 45 Feet Assistive device: Rolling walker (2 wheels) Gait Pattern/deviations: Step-to pattern, Step-through pattern Gait velocity: decreased     General Gait Details: Had Daughter hands on assist Pt amb with walker using safety belt.  Instructed on activity level.   Stairs Stairs: Yes Stairs assistance: Supervision, Contact guard assist Stair Management: No rails, Step to pattern, Forwards, With walker Number of Stairs: 1 General stair comments: Practiced up/down ONE step with Daughter hand on using safety belt with Instructions on proper tech and sequencing.   Wheelchair Mobility     Tilt Bed    Modified Rankin (Stroke Patients Only)       Balance                                            Communication Communication Communication: No apparent difficulties  Cognition Arousal: Alert Behavior During Therapy: WFL for tasks assessed/performed   PT - Cognitive impairments: No apparent impairments                       PT - Cognition Comments: AxO x 3 pleasant and motivated.  Smart/tough Lady who lives home with Spouse and was Ind/driving.  Daughter plans  to stay for 3 days to help.        Cueing Cueing Techniques: Verbal cues  Exercises  05 reps all seated TE's following HEP handout    General Comments        Pertinent Vitals/Pain Pain Assessment Pain Assessment: 0-10 Pain Score: 5  Pain Location: R knee Pain Descriptors / Indicators: Operative site guarding, Sore, Tightness, Tender Pain Intervention(s): Monitored during session, Premedicated before session, Repositioned, Ice applied    Home Living                           Prior Function            PT Goals (current goals can now be found in the care plan section) Progress towards PT goals: Progressing toward goals    Frequency    7X/week      PT Plan      Co-evaluation              AM-PAC PT 6 Clicks Mobility   Outcome Measure  Help needed turning from your back to your side while in a flat bed without using bedrails?: None Help needed moving from lying on your back to sitting on the side of a flat bed without using bedrails?: None Help needed moving to and from a bed to a chair (including a wheelchair)?: None Help needed standing up from a chair using your arms (e.g., wheelchair or bedside chair)?: A Little Help needed to walk in hospital room?: A Little Help needed climbing 3-5 steps with a railing? : A Little 6 Click Score: 21    End of Session Equipment Utilized During Treatment: Gait belt Activity Tolerance: Patient tolerated treatment well Patient left: in chair;with call bell/phone within reach;with chair alarm set Nurse Communication: Mobility status PT Visit Diagnosis: Unsteadiness on feet (R26.81);Muscle weakness (generalized) (M62.81);Difficulty in walking, not elsewhere classified (R26.2)     Time: 8570-8491 PT Time Calculation (min) (ACUTE ONLY): 39 min  Charges:    $Gait Training: 8-22 mins $Therapeutic Exercise: 8-22 mins $Therapeutic Activity: 8-22 mins PT General Charges $$ ACUTE PT VISIT: 1 Visit                     Katheryn Leap  PTA Acute  Rehabilitation Services Office M-F          [phone]

## 2023-10-28 NOTE — Progress Notes (Signed)
 Physical Therapy Treatment Patient Details Name: April Terry MRN: [id number] DOB: 01/23/1941 Today's Date: 10/28/2023   History of Present Illness 83 y.o. female admited 10/27/23 for R TKA. PMH: HTN, polymyalgia rheumatica, RA.    PT Comments  POD # 1 am session PT - Cognition Comments: AxO x 3 pleasant and motivated.  Smart/tough Lady who lives home with Spouse and was Ind/driving.  Daughter plans to stay for 3 days to help. Assisted OOB to amb in hallway.  General bed mobility comments: demonstarted and Instructed how to use her strap to self assist LE OOB if needed. General transfer comment: <25% VC's on safety with turns and proper walker to self distance. General Gait Details: tolerated amb 45 feet with walker at 25% VC's on proper walker to self distance and activity level. Then returned to room to perform some TE's following HEP handout.  Instructed on proper tech, freq as well as use of ICE.   Will see Pt again this afternoon with daughter.  Pt has ONE step to enter her home.    If plan is discharge home, recommend the following: A lot of help with walking and/or transfers;A little help with bathing/dressing/bathroom;Assistance with cooking/housework;Assist for transportation;Help with stairs or ramp for entrance   Can travel by private vehicle        Equipment Recommendations  None recommended by PT    Recommendations for Other Services       Precautions / Restrictions Precautions Precautions: Fall;Knee Precaution/Restrictions Comments: no pillow under knee Restrictions Weight Bearing Restrictions Per Provider Order: No Other Position/Activity Restrictions: WBAT     Mobility  Bed Mobility Overal bed mobility: Modified Independent             General bed mobility comments: demonstarted and Instructed how to use her strap to self assist LE OOB if needed.    Transfers Overall transfer level: Needs assistance Equipment used: Rolling walker (2 wheels) Transfers:  Sit to/from Stand Sit to Stand: Supervision, Contact guard assist           General transfer comment: <25% VC's on safety with turns and proper walker to self distance.    Ambulation/Gait Ambulation/Gait assistance: Supervision, Contact guard assist Gait Distance (Feet): 45 Feet Assistive device: Rolling walker (2 wheels) Gait Pattern/deviations: Step-to pattern, Step-through pattern Gait velocity: decreased     General Gait Details: tolerated amb 45 feet with walker at 25% VC's on proper walker to self distance and activity level.   Stairs             Wheelchair Mobility     Tilt Bed    Modified Rankin (Stroke Patients Only)       Balance                                            Communication Communication Communication: No apparent difficulties  Cognition Arousal: Alert Behavior During Therapy: WFL for tasks assessed/performed   PT - Cognitive impairments: No apparent impairments                       PT - Cognition Comments: AxO x 3 pleasant and motivated.  Smart/tough Lady who lives home with Spouse and was Ind/driving.  Daughter plans to stay for 3 days to help.        Cueing Cueing Techniques: Verbal cues  Exercises  Total Knee Replacement  TE's following HEP handout 10 reps B LE ankle pumps 05 reps towel squeezes 05 reps knee presses 05 reps heel slides  05 reps SAQ's 05 reps SLR's 05 reps ABD Educated on use of gait belt to assist with TE's Followed by ICE     General Comments        Pertinent Vitals/Pain Pain Assessment Pain Assessment: 0-10 Pain Score: 5  Pain Location: R knee Pain Descriptors / Indicators: Operative site guarding, Sore, Tightness, Tender Pain Intervention(s): Monitored during session, Premedicated before session, Repositioned, Ice applied    Home Living                          Prior Function            PT Goals (current goals can now be found in the care plan  section) Progress towards PT goals: Progressing toward goals    Frequency    7X/week      PT Plan      Co-evaluation              AM-PAC PT 6 Clicks Mobility   Outcome Measure  Help needed turning from your back to your side while in a flat bed without using bedrails?: None Help needed moving from lying on your back to sitting on the side of a flat bed without using bedrails?: None Help needed moving to and from a bed to a chair (including a wheelchair)?: None Help needed standing up from a chair using your arms (e.g., wheelchair or bedside chair)?: A Little Help needed to walk in hospital room?: A Little Help needed climbing 3-5 steps with a railing? : A Little 6 Click Score: 21    End of Session Equipment Utilized During Treatment: Gait belt Activity Tolerance: Patient tolerated treatment well Patient left: in chair;with call bell/phone within reach;with chair alarm set Nurse Communication: Mobility status PT Visit Diagnosis: Unsteadiness on feet (R26.81);Muscle weakness (generalized) (M62.81);Difficulty in walking, not elsewhere classified (R26.2)     Time: 8941-8866 PT Time Calculation (min) (ACUTE ONLY): 35 min  Charges:    $Gait Training: 8-22 mins $Therapeutic Exercise: 8-22 mins PT General Charges $$ ACUTE PT VISIT: 1 Visit                     Katheryn Leap  PTA Acute  Rehabilitation Services Office M-F          [phone]

## 2023-10-28 NOTE — Care Management Obs Status (Signed)
 MEDICARE OBSERVATION STATUS NOTIFICATION   Patient Details  Name: April Terry MRN: [id number] Date of Birth: 08-Jun-1940   Medicare Observation Status Notification Given:  Yes    Alfonse JONELLE Rex, RN 10/28/2023, 9:49 AM

## 2023-10-28 NOTE — Progress Notes (Signed)
 Discharge medications delivered to patient at bedside [facility]

## 2023-10-29 NOTE — Discharge Summary (Signed)
 Physician Discharge Summary   Patient ID: April Terry MRN: [id number] DOB/AGE: November 14, 1940 83 y.o.  Admit date: 10/27/2023 Discharge date: 10/28/2023  Primary Diagnosis: Osteoarthritis right knee   Admission Diagnoses:  Past Medical History:  Diagnosis Date   Anemia    Essential hypertension    Fibroids    Per [facility]   H/O polymyalgia rheumatica    Was symptoms have mostly resolved after prolonged treatment with steroids   Hashimoto's thyroiditis    Per [facility]   Hyperlipidemia    Well-controlled on fish oil and low-dose Lipitor.   Lactose intolerance    Per [facility]   Osteopenia of hip, unspecified laterality    Per [facility]   Osteopenia of spine    Per [facility]   Polymyalgia rheumatica (HCC)    Per [facility]   Pure hypercholesterolemia    Per [facility]   Rheumatoid arthritis involving both hands (HCC)    Tingling of both feet    Per [facility]   Vitamin D  deficiency    Per [facility]   Discharge Diagnoses:   Principal Problem:   OA (osteoarthritis) of knee Active Problems:   Primary osteoarthritis of right knee  Estimated body mass index is 27.29 kg/m as calculated from the following:   Height as of this encounter: 5' 3.5 (1.613 m).   Weight as of this encounter: 71 kg.  Procedure:  Procedure(s) (LRB): ARTHROPLASTY, KNEE, TOTAL (Right)   Consults: None  HPI: April Terry is a 83 y.o. year old female with end stage OA of her right knee with progressively worsening pain and dysfunction. She has constant pain, with activity and at rest and significant functional deficits with difficulties even with ADLs. She has had extensive non-op management including analgesics, injections of cortisone and viscosupplements, and home exercise program, but remains in significant pain with significant dysfunction.Radiographs show bone on bone arthritis lateral and patellofemoral . She presents now for right  Total Knee Arthroplasty.  Laboratory Data: Admission on 10/27/2023, Discharged on 10/28/2023  Component Date Value Ref Range Status   WBC 10/28/2023 12.4 (H)  4.0 - 10.5 K/uL Final   RBC 10/28/2023 2.98 (L)  3.87 - 5.11 MIL/uL Final   Hemoglobin 10/28/2023 10.2 (L)  12.0 - 15.0 g/dL Final   HCT 92/77/7974 30.9 (L)  36.0 - 46.0 % Final   MCV 10/28/2023 103.7 (H)  80.0 - 100.0 fL Final   MCH 10/28/2023 34.2 (H)  26.0 - 34.0 pg Final   MCHC 10/28/2023 33.0  30.0 - 36.0 g/dL Final   RDW 92/77/7974 12.0  11.5 - 15.5 % Final   Platelets 10/28/2023 178  150 - 400 K/uL Final   nRBC 10/28/2023 0.0  0.0 - 0.2 % Final   Performed at [facility], [address], [place], [place] [address]   Sodium 10/28/2023 140  135 - 145 mmol/L Final   Potassium 10/28/2023 3.2 (L)  3.5 - 5.1 mmol/L Final   Chloride 10/28/2023 110  98 - 111 mmol/L Final   CO2 10/28/2023 25  22 - 32 mmol/L Final   Glucose, Bld 10/28/2023 137 (H)  70 - 99 mg/dL Final   Glucose reference range applies only to samples taken after fasting for at least 8 hours.   BUN 10/28/2023 10  8 - 23 mg/dL Final   Creatinine, Ser 10/28/2023 0.52  0.44 - 1.00 mg/dL Final   Calcium  10/28/2023 7.4 (L)  8.9 - 10.3 mg/dL Final  GFR, Estimated 10/28/2023 >60  >60 mL/min Final   Comment: (NOTE) Calculated using the CKD-EPI Creatinine Equation (2021)    Anion gap 10/28/2023 5  5 - 15 Final   Performed at [facility], [address], [place], [place] [address]  Hospital Outpatient Visit on 10/17/2023  Component Date Value Ref Range Status   MRSA, PCR 10/17/2023 NEGATIVE  NEGATIVE Final   Staphylococcus aureus 10/17/2023 NEGATIVE  NEGATIVE Final   Comment: (NOTE) The Xpert SA Assay (FDA approved for NASAL specimens in patients 76 years of age and older), is one component of a comprehensive surveillance program. It is not intended to diagnose infection nor to guide or monitor treatment. Performed at [facility], [address], [place], [place] [address]    Sodium 10/17/2023 140  135 - 145 mmol/L Final   Potassium 10/17/2023 3.8  3.5 - 5.1 mmol/L Final   Chloride 10/17/2023 106  98 - 111 mmol/L Final   CO2 10/17/2023 26  22 - 32 mmol/L Final   Glucose, Bld 10/17/2023 103 (H)  70 - 99 mg/dL Final   Glucose reference range applies only to samples taken after fasting for at least 8 hours.   BUN 10/17/2023 16  8 - 23 mg/dL Final   Creatinine, Ser 10/17/2023 0.69  0.44 - 1.00 mg/dL Final   Calcium  10/17/2023 9.6  8.9 - 10.3 mg/dL Final   GFR, Estimated 10/17/2023 >60  >60 mL/min Final   Comment: (NOTE) Calculated using the CKD-EPI Creatinine Equation (2021)    Anion gap 10/17/2023 8  5 - 15 Final   Performed at [facility], [address] [place], [place] [address]   WBC 10/17/2023 6.7  4.0 - 10.5 K/uL Final   RBC 10/17/2023 3.93  3.87 - 5.11 MIL/uL Final   Hemoglobin 10/17/2023 13.8  12.0 - 15.0 g/dL Final   HCT 92/88/7974 39.8  36.0 - 46.0 % Final   MCV 10/17/2023 101.3 (H)  80.0 - 100.0 fL Final   MCH 10/17/2023 35.1 (H)  26.0 - 34.0 pg Final   MCHC 10/17/2023 34.7  30.0 - 36.0 g/dL Final   RDW 92/88/7974 11.9  11.5 - 15.5 % Final   Platelets 10/17/2023 219  150 - 400 K/uL Final   nRBC 10/17/2023 0.0  0.0 - 0.2 % Final   Performed at [facility], [address], [place], [place] [address]     X-Rays:No results found.  EKG: Orders placed or performed in visit on 09/09/23   EKG 12-Lead     Hospital Course: April Terry is a 83 y.o. who was admitted to [facility]. They were brought to the operating room on 10/27/2023 and underwent Procedure(s): ARTHROPLASTY, KNEE, TOTAL.  Patient tolerated the procedure well and was later transferred to the recovery room and then to the orthopaedic floor for postoperative care. They were given PO and IV analgesics for pain control following their surgery. They were given 24 hours  of postoperative antibiotics of  Anti-infectives (From admission, onward)    Start     Dose/Rate Route Frequency Ordered Stop   10/27/23 1700  ceFAZolin  (ANCEF ) IVPB 2g/100 mL premix        2 g 200 mL/hr over 30 Minutes Intravenous Every 6 hours 10/27/23 1406 10/27/23 2300   10/27/23 0830  ceFAZolin  (ANCEF ) IVPB 2g/100 mL premix        2 g 200 mL/hr over 30 Minutes Intravenous On call to O.R.  10/27/23 0823 10/27/23 1050      and started on DVT prophylaxis in the form of Aspirin .   PT and OT were ordered for total joint protocol. Discharge planning consulted to help with postop disposition and equipment needs.  Patient had a fair night on the evening of surgery. They started to get up OOB with therapy on POD #0. Pt was seen during rounds and was ready to go home pending progress with therapy. She worked with therapy on POD #1 and was meeting her goals. Pt was discharged to home later that day in stable condition.  Diet: Regular diet Activity: WBAT Follow-up: in 2 weeks Disposition: Home Discharged Condition: stable   Discharge Instructions     Call MD / Call 911   Complete by: As directed    If you experience chest pain or shortness of breath, CALL 911 and be transported to the hospital emergency room.  If you develope a fever above 101 F, pus (white drainage) or increased drainage or redness at the wound, or calf pain, call your surgeon's office.   Change dressing   Complete by: As directed    You may remove the bulky bandage (ACE wrap and gauze) two days after surgery. You will have an adhesive waterproof bandage underneath. Leave this in place until your first follow-up appointment.   Constipation Prevention   Complete by: As directed    Drink plenty of fluids.  Prune juice may be helpful.  You may use a stool softener, such as Colace (over the counter) 100 mg twice a day.  Use MiraLax  (over the counter) for constipation as needed.   Diet - low sodium heart healthy   Complete by: As  directed    Do not put a pillow under the knee. Place it under the heel.   Complete by: As directed    Driving restrictions   Complete by: As directed    No driving for two weeks   Post-operative opioid taper instructions:   Complete by: As directed    POST-OPERATIVE OPIOID TAPER INSTRUCTIONS: It is important to wean off of your opioid medication as soon as possible. If you do not need pain medication after your surgery it is ok to stop day one. Opioids include: Codeine, Hydrocodone(Norco, Vicodin), Oxycodone (Percocet, oxycontin ) and hydromorphone amongst others.  Long term and even short term use of opiods can cause: Increased pain response Dependence Constipation Depression Respiratory depression And more.  Withdrawal symptoms can include Flu like symptoms Nausea, vomiting And more Techniques to manage these symptoms Hydrate well Eat regular healthy meals Stay active Use relaxation techniques(deep breathing, meditating, yoga) Do Not substitute Alcohol to help with tapering If you have been on opioids for less than two weeks and do not have pain than it is ok to stop all together.  Plan to wean off of opioids This plan should start within one week post op of your joint replacement. Maintain the same interval or time between taking each dose and first decrease the dose.  Cut the total daily intake of opioids by one tablet each day Next start to increase the time between doses. The last dose that should be eliminated is the evening dose.      TED hose   Complete by: As directed    Use stockings (TED hose) for three weeks on both leg(s).  You may remove them at night for sleeping.   Weight bearing as tolerated   Complete by: As directed  Allergies as of 10/28/2023   No Known Allergies      Medication List     STOP taking these medications    aspirin -acetaminophen -caffeine 250-250-65 MG tablet Commonly known as: EXCEDRIN MIGRAINE       TAKE these  medications    acetaminophen  325 MG tablet Commonly known as: TYLENOL  Take 650 mg by mouth every 6 (six) hours as needed for moderate pain (pain score 4-6).   Aspirin  Low Dose 81 MG chewable tablet Generic drug: aspirin  Chew 1 tablet (81 mg total) by mouth 2 (two) times daily for 21 days. Then take one 81 mg aspirin  once a day for three weeks. Then discontinue aspirin .   atorvastatin  10 MG tablet Commonly known as: LIPITOR Take 10 mg by mouth daily.   Calcium  Carb-Cholecalciferol 1000-800 MG-UNIT Tabs Take 1 tablet by mouth daily.   irbesartan -hydrochlorothiazide  150-12.5 MG tablet Commonly known as: AVALIDE Take 1 tablet by mouth daily.   methocarbamol  500 MG tablet Commonly known as: ROBAXIN  Take 1 tablet (500 mg total) by mouth every 6 (six) hours as needed for muscle spasms.   multivitamin-lutein Caps capsule Take 1 capsule by mouth daily.   ondansetron  4 MG tablet Commonly known as: ZOFRAN  Take 1 tablet (4 mg total) by mouth every 6 (six) hours as needed for nausea.   oxyCODONE  5 MG immediate release tablet Commonly known as: Oxy IR/ROXICODONE  Take 1-2 tablets (5-10 mg total) by mouth every 6 (six) hours as needed for severe pain (pain score 7-10).   sertraline  50 MG tablet Commonly known as: ZOLOFT  Take 50 mg by mouth daily.   traMADol  50 MG tablet Commonly known as: ULTRAM  Take 1-2 tablets (50-100 mg total) by mouth every 6 (six) hours as needed for moderate pain (pain score 4-6).               Discharge Care Instructions  (From admission, onward)           Start     Ordered   10/28/23 0000  Weight bearing as tolerated        10/28/23 0820   10/28/23 0000  Change dressing       Comments: You may remove the bulky bandage (ACE wrap and gauze) two days after surgery. You will have an adhesive waterproof bandage underneath. Leave this in place until your first follow-up appointment.   10/28/23 0820            Follow-up Information      Melodi Lerner, MD. Go on 11/11/2023.   Specialty: Orthopedic Surgery Why: You are scheduled for a post op appointment on Tuesday8/5/25 at 1:15pm Contact information: [address] [place] [address] [phone]         [facility] Go on 10/30/2023.   Why: You are scheduled for physical therapy Thursday 10/30/23 at 11:30am Contact information: [address] [place] [address] [phone]                 Signed: R. Zelda Kobs, PA-C Orthopedic Surgery 10/29/2023, 8:29 AM

## 2023-10-30 DIAGNOSIS — M25561 Pain in right knee: Secondary | ICD-10-CM | POA: Diagnosis not present

## 2023-11-03 DIAGNOSIS — M25561 Pain in right knee: Secondary | ICD-10-CM | POA: Diagnosis not present

## 2023-11-05 DIAGNOSIS — M25561 Pain in right knee: Secondary | ICD-10-CM | POA: Diagnosis not present

## 2023-11-10 DIAGNOSIS — M25561 Pain in right knee: Secondary | ICD-10-CM | POA: Diagnosis not present

## 2023-11-12 DIAGNOSIS — M25561 Pain in right knee: Secondary | ICD-10-CM | POA: Diagnosis not present

## 2023-11-17 DIAGNOSIS — M25561 Pain in right knee: Secondary | ICD-10-CM | POA: Diagnosis not present

## 2023-11-24 DIAGNOSIS — M25561 Pain in right knee: Secondary | ICD-10-CM | POA: Diagnosis not present

## 2023-11-25 DIAGNOSIS — H52203 Unspecified astigmatism, bilateral: Secondary | ICD-10-CM | POA: Diagnosis not present

## 2023-11-25 DIAGNOSIS — H2513 Age-related nuclear cataract, bilateral: Secondary | ICD-10-CM | POA: Diagnosis not present

## 2023-11-27 DIAGNOSIS — M25561 Pain in right knee: Secondary | ICD-10-CM | POA: Diagnosis not present

## 2023-12-01 DIAGNOSIS — M25561 Pain in right knee: Secondary | ICD-10-CM | POA: Diagnosis not present

## 2023-12-03 DIAGNOSIS — Z5189 Encounter for other specified aftercare: Secondary | ICD-10-CM | POA: Diagnosis not present

## 2023-12-05 DIAGNOSIS — M25561 Pain in right knee: Secondary | ICD-10-CM | POA: Diagnosis not present

## 2023-12-15 ENCOUNTER — Other Ambulatory Visit (HOSPITAL_BASED_OUTPATIENT_CLINIC_OR_DEPARTMENT_OTHER): Payer: Self-pay

## 2023-12-15 ENCOUNTER — Ambulatory Visit: Payer: Medicare Other | Admitting: Nurse Practitioner

## 2023-12-15 ENCOUNTER — Encounter: Payer: Self-pay | Admitting: Nurse Practitioner

## 2023-12-15 VITALS — BP 128/80 | HR 74 | Temp 97.1°F | Ht 63.5 in | Wt 156.0 lb

## 2023-12-15 DIAGNOSIS — Z23 Encounter for immunization: Secondary | ICD-10-CM | POA: Diagnosis not present

## 2023-12-15 DIAGNOSIS — Z0000 Encounter for general adult medical examination without abnormal findings: Secondary | ICD-10-CM | POA: Diagnosis not present

## 2023-12-15 MED ORDER — COVID-19 MRNA VAC-TRIS(PFIZER) 30 MCG/0.3ML IM SUSY
0.5000 mL | PREFILLED_SYRINGE | INTRAMUSCULAR | 1 refills | Status: AC
  Filled 2023-12-15: qty 0.5, fill #0
  Filled 2024-01-20: qty 0.3, 1d supply, fill #0

## 2023-12-15 NOTE — Progress Notes (Signed)
 Subjective:   April Terry is a 83 y.o. female who presents for Medicare Annual (Subsequent) preventive examination.  Visit Complete: In person [facility]    Cardiac Risk Factors include: advanced age (>69men, >64 women);dyslipidemia;hypertension     Objective:    Today's Vitals   12/15/23 1111 12/15/23 1123  BP: 128/80   Pulse: 74   Temp: (!) 97.1 F (36.2 C)   SpO2: 99%   Weight: 156 lb (70.8 kg)   Height: 5' 3.5 (1.613 m)   PainSc:  4    Body mass index is 27.2 kg/m.     12/15/2023   11:10 AM 10/27/2023    8:39 AM 10/17/2023   10:58 AM 01/13/2023    1:07 PM 02/08/2022   10:51 AM  Advanced Directives  Does Patient Have a Medical Advance Directive? Yes Yes Yes Yes Yes  Type of Estate agent of [place];Living will;Out of facility DNR (pink MOST or yellow form) Healthcare Power of [place];Living will Healthcare Power of [place];Living will Out of facility DNR (pink MOST or yellow form);Healthcare Power of [place];Living will Healthcare Power of [place];Living will  Does patient want to make changes to medical advance directive? No - Patient declined No - Patient declined  No - Patient declined   Copy of Healthcare Power of Attorney in Chart? Yes - validated most recent copy scanned in chart (See row information) Yes - validated most recent copy scanned in chart (See row information) No - copy requested No - copy requested   Pre-existing out of facility DNR order (yellow form or pink MOST form) Pink MOST form placed in chart (order not valid for inpatient use)   Pink MOST form placed in chart (order not valid for inpatient use)     Current Medications (verified) Outpatient Encounter Medications as of 12/15/2023  Medication Sig   acetaminophen  (TYLENOL ) 325 MG tablet Take 650 mg by mouth every 6 (six) hours as needed for moderate pain (pain score 4-6).   atorvastatin  (LIPITOR) 10 MG tablet Take 10 mg by mouth daily.   Calcium  Carb-Cholecalciferol 1000-800  MG-UNIT TABS Take 1 tablet by mouth daily.   COVID-19 mRNA vaccine, Moderna, >/= 48yrs, (SPIKEVAX ) injection Inject 0.5 mLs into the muscle as directed.   irbesartan -hydrochlorothiazide  (AVALIDE) 150-12.5 MG tablet Take 1 tablet by mouth daily.   methocarbamol  (ROBAXIN ) 500 MG tablet Take 1 tablet (500 mg total) by mouth every 6 (six) hours as needed for muscle spasms.   multivitamin-lutein (OCUVITE-LUTEIN) CAPS capsule Take 1 capsule by mouth daily.   sertraline  (ZOLOFT ) 50 MG tablet Take 50 mg by mouth daily.   [DISCONTINUED] ondansetron  (ZOFRAN ) 4 MG tablet Take 1 tablet (4 mg total) by mouth every 6 (six) hours as needed for nausea.   [DISCONTINUED] oxyCODONE  (OXY IR/ROXICODONE ) 5 MG immediate release tablet Take 1-2 tablets (5-10 mg total) by mouth every 6 (six) hours as needed for severe pain (pain score 7-10).   [DISCONTINUED] traMADol  (ULTRAM ) 50 MG tablet Take 1-2 tablets (50-100 mg total) by mouth every 6 (six) hours as needed for moderate pain (pain score 4-6).   No facility-administered encounter medications on file as of 12/15/2023.    Allergies (verified) Patient has no known allergies.   History: Past Medical History:  Diagnosis Date   Anemia    Essential hypertension    Fibroids    Per [facility]   H/O polymyalgia rheumatica    Was symptoms have mostly resolved after prolonged treatment with steroids   Hashimoto's thyroiditis  Per [facility]   Hyperlipidemia    Well-controlled on fish oil and low-dose Lipitor.   Lactose intolerance    Per [facility]   Osteopenia of hip, unspecified laterality    Per [facility]   Osteopenia of spine    Per [facility]   Polymyalgia rheumatica (HCC)    Per [facility]   Pure hypercholesterolemia    Per [facility]   Rheumatoid arthritis involving both hands (HCC)    Tingling of both feet    Per [facility]   Vitamin D  deficiency    Per [facility]   Past Surgical History:   Procedure Laterality Date   ABDOMINAL HYSTERECTOMY  1984   APPENDECTOMY  1950   COLONOSCOPY  2003   Per [facility]   COLONOSCOPY  2013   COLONOSCOPY  2024   Per [facility]   PARTIAL HYSTERECTOMY  1983   Ovaries retained, Per [facility]   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  EF 55-60%.  No regional wall motion normality.  No mitral prolapse noted.   Family History  Problem Relation Age of Onset   Obesity Mother    Heart failure Mother    Stroke Father    Heart disease Sister    Heart failure Sister    Arthritis Brother    Heart disease Brother    Colon cancer Brother        Per [facility]   Obesity Brother    Diabetes Brother        [facility]   Thyroid  cancer Daughter        10-12 years ago as of 2024   Depression Son    Anxiety disorder Son    Alcoholism Son        Recovering   Heart failure Maternal Aunt        Per [facility]   Aortic aneurysm Cousin    Social History   Socioeconomic History   Marital status: Married    Spouse name: Tanda   Number of children: 2   Years of education: Not on file   Highest education level: Some college, no degree  Occupational History   Occupation: Manufacturing engineer    Comment: Retired.  Used to work in [place] .  Tobacco Use   Smoking status: Never   Smokeless tobacco: Never  Vaping Use   Vaping status: Never Used  Substance and Sexual Activity   Alcohol use: Yes    Comment: Occasinoal wine   Drug use: No   Sexual activity: Not Currently  Other Topics Concern   Not on file  Social History Narrative   Married Isadora) mother of 2, 3 grandchildren and 2 great-grandchildren   Are you right handed or left handed? Right   Are you currently employed ? no      Do you live  at home alone? Live with husband of 61 years    Caffeine daily 2 cup   What type of home do you live in: 1 story or 2 story? one       Social Drivers of Corporate investment banker Strain: Low Risk  (07/04/2023)   Overall Financial Resource Strain (CARDIA)    Difficulty of Paying Living Expenses: Not hard at all  Food Insecurity: No Food Insecurity (10/27/2023)  Hunger Vital Sign    Worried About Running Out of Food in the Last Year: Never true    Ran Out of Food in the Last Year: Never true  Transportation Needs: No Transportation Needs (10/27/2023)   PRAPARE - Administrator, Civil Service (Medical): No    Lack of Transportation (Non-Medical): No  Physical Activity: Inactive (07/04/2023)   Exercise Vital Sign    Days of Exercise per Week: 0 days    Minutes of Exercise per Session: 30 min  Stress: No Stress Concern Present (07/04/2023)   [employer] of Occupational Health - Occupational Stress Questionnaire    Feeling of Stress : Only a little  Social Connections: Socially Integrated (10/27/2023)   Social Connection and Isolation Panel    Frequency of Communication with Friends and Family: Three times a week    Frequency of Social Gatherings with Friends and Family: Twice a week    Attends Religious Services: 1 to 4 times per year    Active Member of [employer] or Organizations: Yes    Attends Banker Meetings: 1 to 4 times per year    Marital Status: Married    Tobacco Counseling Counseling given: Not Answered   Clinical Intake:  Pre-visit preparation completed: Yes  Pain : 0-10 Pain Score: 4  Pain Type: Acute pain Pain Location: Knee Pain Orientation: Right Pain Descriptors / Indicators: Aching Pain Onset: 1 to 4 weeks ago Pain Frequency: Constant     BMI - recorded: 27 Nutritional Status: BMI 25 -29 Overweight Diabetes: No  How often do you need to have someone help you when you read instructions, pamphlets, or other written materials  from your doctor or pharmacy?: 1 - Never         Activities of Daily Living    12/15/2023   11:22 AM 12/11/2023    8:11 PM  In your present state of health, do you have any difficulty performing the following activities:  Hearing? 0 0  Vision? 0 0  Difficulty concentrating or making decisions? 0 0  Walking or climbing stairs? 0 0  Dressing or bathing? 0 0  Doing errands, shopping? 0 0  Preparing Food and eating ? N N  Using the Toilet? N N  In the past six months, have you accidently leaked urine? Y Y  Do you have problems with loss of bowel control? N N  Managing your Medications? N N  Managing your Finances? N N  Housekeeping or managing your Housekeeping? N N    Patient Care Team: Caro Harlene POUR, NP as PCP - General (Geriatric Medicine) Anner Alm ORN, MD as PCP - Cardiology (Cardiology) Anner Alm ORN, MD as Consulting Physician (Cardiology) Melodi Lerner, MD as Consulting Physician (Orthopedic Surgery)  Indicate any recent Medical Services you may have received from other than [facility] providers in the past year (date may be approximate).     Assessment:   This is a routine wellness examination for [place].  Hearing/Vision screen No results found.   Goals Addressed   None    Depression Screen    12/15/2023   11:10 AM 01/13/2023    1:07 PM  PHQ 2/9 Scores  PHQ - 2 Score 0 0    Fall Risk    12/11/2023    8:11 PM 01/13/2023    1:07 PM 02/08/2022   10:50 AM  Fall Risk   Falls in the past year? 0 0 0  Number falls in past yr: 0 0  0  Injury with Fall? 0 0 0  Risk for fall due to : History of fall(s) No Fall Risks   Follow up Falls evaluation completed Falls evaluation completed Falls evaluation completed      Data saved with a previous flowsheet row definition    MEDICARE RISK AT HOME: Medicare Risk at Home Any stairs in or around the home?: No If so, are there any without handrails?: (Patient-Rptd) No Home free of loose throw rugs in walkways, pet  beds, electrical cords, etc?: Yes Adequate lighting in your home to reduce risk of falls?: Yes Life alert?: No Use of a cane, walker or w/c?: No Grab bars in the bathroom?: Yes Shower chair or bench in shower?: Yes Elevated toilet seat or a handicapped toilet?: Yes  TIMED UP AND GO:  Was the test performed?  No    Cognitive Function:        12/15/2023   11:14 AM  6CIT Screen  What Year? 0 points  What month? 0 points  What time? 0 points  Count back from 20 0 points  Months in reverse 0 points  Repeat phrase 0 points  Total Score 0 points    Immunizations Immunization History  Administered Date(s) Administered   Fluad Quad(high Dose 65+) 02/08/2019, 01/24/2021   Fluad Trivalent(High Dose 65+) 01/13/2023   Hepatitis A 04/28/2018   Hepatitis A, Adult 10/29/2017   INFLUENZA, HIGH DOSE SEASONAL PF 04/30/2017, 01/17/2020, 01/03/2022, 12/15/2023   Influenza-Unspecified 04/09/2015, 02/13/2016, 01/22/2021   PFIZER Comirnaty ETTERGray Top)Covid-19 Tri-Sucrose Vaccine 08/17/2020   PFIZER(Purple Top)SARS-COV-2 Vaccination 05/03/2019, 05/24/2019, 03/09/2020   Pfizer Covid-19 Vaccine Bivalent Booster 38yrs & up 02/09/2021   Pfizer(Comirnaty )Fall Seasonal Vaccine 12 years and older 01/18/2022   Pneumococcal Conjugate-13 09/25/2015   Pneumococcal Polysaccharide-23 10/16/2016   Tdap 09/17/2012   Zoster Recombinant(Shingrix) 04/08/2009, 11/09/2018, 03/15/2019    TDAP status: Due, Education has been provided regarding the importance of this vaccine. Advised may receive this vaccine at local pharmacy or Health Dept. Aware to provide a copy of the vaccination record if obtained from local pharmacy or Health Dept. Verbalized acceptance and understanding.  Flu Vaccine status: Completed at today's visit  Pneumococcal vaccine status: Up to date  Covid-19 vaccine status: Information provided on how to obtain vaccines.   Qualifies for Shingles Vaccine? Yes   Zostavax completed No   Shingrix  Completed?: Yes  Screening Tests Health Maintenance  Topic Date Due   DTaP/Tdap/Td (2 - Td or Tdap) 09/18/2022   COVID-19 Vaccine (7 - 2025-26 season) 12/08/2023   Medicare Annual Wellness (AWV)  12/14/2024   Pneumococcal Vaccine: 50+ Years  Completed   Influenza Vaccine  Completed   DEXA SCAN  Completed   Zoster Vaccines- Shingrix  Completed   HPV VACCINES  Aged Out   Meningococcal B Vaccine  Aged Out    Health Maintenance  Health Maintenance Due  Topic Date Due   DTaP/Tdap/Td (2 - Td or Tdap) 09/18/2022   COVID-19 Vaccine (7 - 2025-26 season) 12/08/2023    Colorectal cancer screening: No longer required.   Mammogram status: No longer required due to age.  Bone Density status: Completed 05/2022. Results reflect: Bone density results: OSTEOPENIA. Repeat every 2 years.  Lung Cancer Screening: (Low Dose CT Chest recommended if Age 83-80 years, 20 pack-year currently smoking OR have quit w/in 15years.) does not qualify.   Lung Cancer Screening Referral: na  Additional Screening:  Hepatitis C Screening: does not qualify  Vision Screening: Recommended annual ophthalmology exams for early  detection of glaucoma and other disorders of the eye. Is the patient up to date with their annual eye exam?  Yes  Who is the provider or what is the name of the office in which the patient attends annual eye exams? [facility]  If pt is not established with a provider, would they like to be referred to a provider to establish care? No .   Dental Screening: Recommended annual dental exams for proper oral hygiene  Community Resource Referral / Chronic Care Management: CRR required this visit?  No   CCM required this visit?  No     Plan:     I have personally reviewed and noted the following in the patient's chart:   Medical and social history Use of alcohol, tobacco or illicit drugs  Current medications and supplements including opioid prescriptions. Patient is not currently taking  opioid prescriptions. Functional ability and status Nutritional status Physical activity Advanced directives List of other physicians Hospitalizations, surgeries, and ER visits in previous 12 months Vitals Screenings to include cognitive, depression, and falls Referrals and appointments  In addition, I have reviewed and discussed with patient certain preventive protocols, quality metrics, and best practice recommendations. A written personalized care plan for preventive services as well as general preventive health recommendations were provided to patient.     Harlene MARLA An, NP   12/15/2023

## 2023-12-15 NOTE — Patient Instructions (Addendum)
 1.) Visit your local pharmacy to receive your tetanus and covid booster (prescription sent).   2.) Flu vaccine given today   April Terry , Thank you for taking time to come for your Medicare Wellness Visit. I appreciate your ongoing commitment to your health goals. Please review the following plan we discussed and let me know if I can assist you in the future.     This is a list of the screening recommended for you and due dates:  Health Maintenance  Topic Date Due   DTaP/Tdap/Td vaccine (2 - Td or Tdap) 09/18/2022   COVID-19 Vaccine (7 - 2025-26 season) 12/08/2023   Medicare Annual Wellness Visit  12/14/2024   Pneumococcal Vaccine for age over 25  Completed   Flu Shot  Completed   DEXA scan (bone density measurement)  Completed   Zoster (Shingles) Vaccine  Completed   HPV Vaccine  Aged Out   Meningitis B Vaccine  Aged Out

## 2023-12-18 ENCOUNTER — Other Ambulatory Visit: Payer: Self-pay | Admitting: Nurse Practitioner

## 2023-12-18 MED ORDER — ATORVASTATIN CALCIUM 10 MG PO TABS: 10.0000 mg | ORAL_TABLET | Freq: Every day | ORAL | 1 refills | Status: AC

## 2023-12-18 MED ORDER — IRBESARTAN-HYDROCHLOROTHIAZIDE 150-12.5 MG PO TABS: 1.0000 | ORAL_TABLET | Freq: Every day | ORAL | 1 refills | Status: AC

## 2023-12-18 MED ORDER — SERTRALINE HCL 50 MG PO TABS: 50.0000 mg | ORAL_TABLET | Freq: Every day | ORAL | 1 refills | Status: AC

## 2023-12-18 NOTE — Telephone Encounter (Signed)
 Medication has never been prescribed by pcp Caro Jessica K, NP

## 2023-12-18 NOTE — Telephone Encounter (Signed)
 Copied from CRM [id number]. Topic: Clinical - Medication Refill >> Dec 18, 2023 11:10 AM Merlynn A wrote: Medication: irbesartan -hydrochlorothiazide  (AVALIDE) 150-12.5 MG tablet, atorvastatin  (LIPITOR) 10 MG tablet, sertraline  (ZOLOFT ) 50 MG tablet  Has the patient contacted their pharmacy? No (Agent: If no, request that the patient contact the pharmacy for the refill. If patient does not wish to contact the pharmacy document the reason why and proceed with request.) (Agent: If yes, when and what did the pharmacy advise?)  This is the patient's preferred pharmacy:  [id number] [place], [place] - [address] [place] [address] Phone: [phone] Fax: [phone]  Is this the correct pharmacy for this prescription? Yes If no, delete pharmacy and type the correct one.   Has the prescription been filled recently? No  Is the patient out of the medication? No  Has the patient been seen for an appointment in the last year OR does the patient have an upcoming appointment? Yes  Can we respond through MyChart? Yes  Agent: Please be advised that Rx refills may take up to 3 business days. We ask that you follow-up with your pharmacy.

## 2024-01-09 ENCOUNTER — Ambulatory Visit (HOSPITAL_COMMUNITY)
Admission: RE | Admit: 2024-01-09 | Discharge: 2024-01-09 | Disposition: A | Discharge status: Home / Self Care | Source: Ambulatory Visit | Attending: Cardiology | Admitting: Cardiology

## 2024-01-09 DIAGNOSIS — I517 Cardiomegaly: Secondary | ICD-10-CM | POA: Diagnosis not present

## 2024-01-09 DIAGNOSIS — I728 Aneurysm of other specified arteries: Secondary | ICD-10-CM | POA: Insufficient documentation

## 2024-01-09 DIAGNOSIS — K449 Diaphragmatic hernia without obstruction or gangrene: Secondary | ICD-10-CM | POA: Diagnosis not present

## 2024-01-09 DIAGNOSIS — I700 Atherosclerosis of aorta: Secondary | ICD-10-CM | POA: Diagnosis not present

## 2024-01-09 DIAGNOSIS — Z8249 Family history of ischemic heart disease and other diseases of the circulatory system: Secondary | ICD-10-CM | POA: Insufficient documentation

## 2024-01-09 MED ORDER — IOHEXOL 350 MG/ML SOLN
100.0000 mL | Freq: Once | INTRAVENOUS | Status: AC | PRN
  Administered 2024-01-09: 100 mL via INTRAVENOUS

## 2024-01-12 ENCOUNTER — Ambulatory Visit: Payer: Self-pay | Admitting: Cardiology

## 2024-01-19 ENCOUNTER — Ambulatory Visit: Payer: Self-pay

## 2024-01-19 NOTE — Telephone Encounter (Signed)
 Message routed to Dr.Veludandi as FYI. Patient placed on schedule for tomorrow.

## 2024-01-19 NOTE — Telephone Encounter (Signed)
 Reason for Disposition . Urinating more frequently than usual (i.e., frequency) OR new-onset of the feeling of an urgent need to urinate (i.e., urgency)  Answer Assessment - Initial Assessment Questions Knee replacement surgery 2 months ago and had catheter. Pain is still present while urinating along with urgency.  My knee feels better so I can feel this now.Just seems to be getting worse over last several weeks.     1. SYMPTOM: What's the main symptom you're concerned about? (e.g., frequency, incontinence)     pain 2. ONSET: When did the   pain start?     2 months 3. PAIN: Is there any pain? If Yes, ask: How bad is it? (Scale: 1-10; mild, moderate, severe)     Mild-moderate 4. CAUSE: What do you think is causing the symptoms?     UTI 5. OTHER SYMPTOMS: Do you have any other symptoms? (e.g., blood in urine, fever, flank pain, pain with urination)     Urgency  Protocols used: Urinary Symptoms-A-AH

## 2024-01-19 NOTE — Telephone Encounter (Signed)
 FYI Only or Action Required?: Action required by provider: request for appointment.  Patient was last seen in primary care on 12/15/2023 by Caro Harlene POUR, NP.  Called Nurse Triage reporting Urinary Tract Infection.  Symptoms began several weeks ago.  Interventions attempted: Nothing.  Symptoms are: unchanged.  Triage Disposition: See Physician Within 24 Hours  Patient/caregiver understands and will follow disposition?: Yes

## 2024-01-20 ENCOUNTER — Other Ambulatory Visit (HOSPITAL_BASED_OUTPATIENT_CLINIC_OR_DEPARTMENT_OTHER): Payer: Self-pay

## 2024-01-20 ENCOUNTER — Ambulatory Visit: Admitting: Sports Medicine

## 2024-01-20 ENCOUNTER — Encounter: Payer: Self-pay | Admitting: Sports Medicine

## 2024-01-20 VITALS — BP 122/78 | HR 72 | Temp 97.8°F | Ht 63.5 in | Wt 160.0 lb

## 2024-01-20 DIAGNOSIS — I10 Essential (primary) hypertension: Secondary | ICD-10-CM

## 2024-01-20 DIAGNOSIS — Z23 Encounter for immunization: Secondary | ICD-10-CM | POA: Diagnosis not present

## 2024-01-20 DIAGNOSIS — R5383 Other fatigue: Secondary | ICD-10-CM | POA: Diagnosis not present

## 2024-01-20 DIAGNOSIS — F329 Major depressive disorder, single episode, unspecified: Secondary | ICD-10-CM

## 2024-01-20 DIAGNOSIS — R300 Dysuria: Secondary | ICD-10-CM

## 2024-01-20 LAB — CBC WITH DIFFERENTIAL/PLATELET
Absolute Lymphocytes: 1562 {cells}/uL (ref 850–3900)
Absolute Monocytes: 518 {cells}/uL (ref 200–950)
Basophils Absolute: 51 {cells}/uL (ref 0–200)
Basophils Relative: 0.8 %
Eosinophils Absolute: 352 {cells}/uL (ref 15–500)
Eosinophils Relative: 5.5 %
HCT: 39.8 % (ref 35.0–45.0)
Hemoglobin: 13.3 g/dL (ref 11.7–15.5)
MCH: 33.8 pg — ABNORMAL HIGH (ref 27.0–33.0)
MCHC: 33.4 g/dL (ref 32.0–36.0)
MCV: 101 fL — ABNORMAL HIGH (ref 80.0–100.0)
MPV: 10 fL (ref 7.5–12.5)
Monocytes Relative: 8.1 %
Neutro Abs: 3917 {cells}/uL (ref 1500–7800)
Neutrophils Relative %: 61.2 %
Platelets: 225 Thousand/uL (ref 140–400)
RBC: 3.94 Million/uL (ref 3.80–5.10)
RDW: 12.3 % (ref 11.0–15.0)
Total Lymphocyte: 24.4 %
WBC: 6.4 Thousand/uL (ref 3.8–10.8)

## 2024-01-20 LAB — BASIC METABOLIC PANEL WITH GFR
BUN: 17 mg/dL (ref 7–25)
CO2: 29 mmol/L (ref 20–32)
Calcium: 9 mg/dL (ref 8.6–10.4)
Chloride: 103 mmol/L (ref 98–110)
Creat: 0.76 mg/dL (ref 0.60–0.95)
Glucose, Bld: 91 mg/dL (ref 65–99)
Potassium: 3.9 mmol/L (ref 3.5–5.3)
Sodium: 140 mmol/L (ref 135–146)
eGFR: 78 mL/min/1.73m2 (ref >=60)

## 2024-01-20 NOTE — Progress Notes (Signed)
 Careteam: Patient Care Team: Caro Harlene POUR, NP as PCP - General (Geriatric Medicine) Anner Alm ORN, MD as PCP - Cardiology (Cardiology) Anner Alm ORN, MD as Consulting Physician (Cardiology) Melodi Lerner, MD as Consulting Physician (Orthopedic Surgery)  PLACE OF SERVICE:  [facility]  Advanced Directive information    No Known Allergies  Chief Complaint  Patient presents with   Acute Visit    Patient stated that the Uti symptoms have been going on for about a few week , patient stated she's noticed discomfort. Pt is experiencing dysuria      Discussed the use of AI scribe software for clinical note transcription with the patient, who gave verbal consent to proceed.  History of Present Illness  April Terry is an 83 year old female who presents with urinary discomfort and frequent urination following a total knee replacement.  She has been experiencing dysuria and urinary frequency, particularly in the last few weeks. These symptoms began after her total knee replacement surgery two months ago, during which she had a catheter for two days. She initially thought the discomfort was due to post-surgery medications, including oxycodone , which she does not usually take.  She reports no lower abdominal pain, hematuria, or significant changes in her chronic lower back pain. She notes decreased urine flow but no hematuria.  She denies fever, with her temperature remaining around 97-91F. She also reports fatigue and occasional cognitive cloudiness.  Her current medication includes Robaxin , which she took once last week for exercise-related discomfort. She has a history of depression but states her mood is stable and she takes medication for it occasionally. Her mood is sometimes affected by family stressors.  No chest pain, dyspnea, or significant dizziness. She has had a slight cough, which she attributes to the time of year.    Review of Systems:  Review of Systems   Constitutional:  Negative for chills and fever.  HENT:  Negative for congestion and sore throat.   Eyes:  Negative for double vision.  Respiratory:  Negative for cough, sputum production and shortness of breath.   Cardiovascular:  Negative for chest pain, palpitations and leg swelling.  Gastrointestinal:  Negative for abdominal pain, heartburn and nausea.  Genitourinary:  Positive for dysuria. Negative for frequency and hematuria.  Musculoskeletal:  Negative for falls.  Neurological:  Negative for dizziness, sensory change and focal weakness.   Negative unless indicated in HPI.   Past Medical History:  Diagnosis Date   Anemia    Essential hypertension    Fibroids    Per [facility]   H/O polymyalgia rheumatica    Was symptoms have mostly resolved after prolonged treatment with steroids   Hashimoto's thyroiditis    Per [facility]   Hyperlipidemia    Well-controlled on fish oil and low-dose Lipitor.   Lactose intolerance    Per [facility]   Osteopenia of hip, unspecified laterality    Per [facility]   Osteopenia of spine    Per [facility]   Polymyalgia rheumatica    Per [facility]   Pure hypercholesterolemia    Per [facility]   Rheumatoid arthritis involving both hands (HCC)    Tingling of both feet    Per [facility]   Vitamin D  deficiency    Per [facility]   Past Surgical History:  Procedure Laterality Date   ABDOMINAL HYSTERECTOMY  1984   APPENDECTOMY  1950   COLONOSCOPY  2003   Per [facility]  COLONOSCOPY  2013   COLONOSCOPY  2024   Per [facility]   PARTIAL HYSTERECTOMY  1983   Ovaries retained, Per [facility]   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  EF 55-60%.  No regional wall motion  normality.  No mitral prolapse noted.   Social History:   reports that she has never smoked. She has never used smokeless tobacco. She reports current alcohol use. She reports that she does not use drugs.  Family History  Problem Relation Age of Onset   Obesity Mother    Heart failure Mother    Stroke Father    Heart disease Sister    Heart failure Sister    Arthritis Brother    Heart disease Brother    Colon cancer Brother        Per [facility]   Obesity Brother    Diabetes Brother        Per [facility]   Thyroid  cancer Daughter        10-12 years ago as of 2024   Depression Son    Anxiety disorder Son    Alcoholism Son        Recovering   Heart failure Maternal Aunt        Per [facility]   Aortic aneurysm Cousin     Medications: Patient's Medications  New Prescriptions   No medications on file  Previous Medications   ACETAMINOPHEN  (TYLENOL ) 325 MG TABLET    Take 650 mg by mouth every 6 (six) hours as needed for moderate pain (pain score 4-6).   ATORVASTATIN  (LIPITOR) 10 MG TABLET    Take 1 tablet (10 mg total) by mouth daily.   CALCIUM  CARB-CHOLECALCIFEROL 1000-800 MG-UNIT TABS    Take 1 tablet by mouth daily.   COVID-19 MRNA VACCINE, MODERNA, >/= 52YRS, (SPIKEVAX ) INJECTION    Inject 0.5 mLs into the muscle as directed.   IRBESARTAN -HYDROCHLOROTHIAZIDE  (AVALIDE) 150-12.5 MG TABLET    Take 1 tablet by mouth daily.   METHOCARBAMOL  (ROBAXIN ) 500 MG TABLET    Take 1 tablet (500 mg total) by mouth every 6 (six) hours as needed for muscle spasms.   MULTIVITAMIN-LUTEIN (OCUVITE-LUTEIN) CAPS CAPSULE    Take 1 capsule by mouth daily.   SERTRALINE  (ZOLOFT ) 50 MG TABLET    Take 1 tablet (50 mg total) by mouth daily.  Modified Medications   No medications on file  Discontinued Medications   No medications on file    Physical Exam: Vitals:   01/20/24 0851  BP: 122/78  Pulse: 72  Temp: 97.8 F (36.6 C)  TempSrc: Temporal  SpO2: 95%  Weight: 160 lb  (72.6 kg)  Height: 5' 3.5 (1.613 m)   Body mass index is 27.9 kg/m. BP Readings from Last 3 Encounters:  01/20/24 122/78  12/15/23 128/80  10/28/23 (!) 113/44   Wt Readings from Last 3 Encounters:  01/20/24 160 lb (72.6 kg)  12/15/23 156 lb (70.8 kg)  10/27/23 156 lb 8.4 oz (71 kg)    Physical Exam Constitutional:      Appearance: Normal appearance.  HENT:     Head: Normocephalic and atraumatic.  Cardiovascular:     Rate and Rhythm: Normal rate and regular rhythm.  Pulmonary:     Effort: Pulmonary effort is normal. No respiratory distress.  Breath sounds: Normal breath sounds. No wheezing.  Abdominal:     General: Bowel sounds are normal. There is no distension.     Tenderness: There is no abdominal tenderness. There is no guarding or rebound.     Comments:    Musculoskeletal:        General: No swelling.  Neurological:     Mental Status: She is alert. Mental status is at baseline.     Motor: No weakness.     Labs reviewed: Basic Metabolic Panel: Recent Labs    07/07/23 1059 10/17/23 1112 10/28/23 0339  NA 143 140 140  K 3.8 3.8 3.2*  CL 105 106 110  CO2 29 26 25   GLUCOSE 95 103* 137*  BUN 13 16 10   CREATININE 0.71 0.69 0.52  CALCIUM  9.8 9.6 7.4*   Liver Function Tests: Recent Labs    07/07/23 1059  AST 19  ALT 19  BILITOT 0.7  PROT 6.5   No results for input(s): LIPASE, AMYLASE in the last 8760 hours. No results for input(s): AMMONIA in the last 8760 hours. CBC: Recent Labs    07/07/23 1059 10/17/23 1112 10/28/23 0339  WBC 6.0 6.7 12.4*  NEUTROABS 3,864  --   --   HGB 13.5 13.8 10.2*  HCT 39.8 39.8 30.9*  MCV 102.8* 101.3* 103.7*  PLT 237 219 178   Lipid Panel: Recent Labs    07/07/23 1059  CHOL 153  HDL 63  LDLCALC 71  TRIG 107  CHOLHDL 2.4   TSH: No results for input(s): TSH in the last 8760 hours. A1C: No results found for: HGBA1C  Assessment and Plan Assessment & Plan   1. Dysuria (Primary)  Pt c/o  dysuria since 2 months Afebrile Vitals stable Pt not able to give urine sample Gave the specimen cup to take home Will check cbc, bmp - Culture, Urine  2. Other fatigue  Will check labs - CBC with Differential/Platelet - Basic Metabolic Panel  3. Primary hypertension  At goal  Cont with avalide  4. 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 

## 2024-01-21 ENCOUNTER — Ambulatory Visit: Payer: Self-pay | Admitting: Sports Medicine

## 2024-01-21 DIAGNOSIS — R300 Dysuria: Secondary | ICD-10-CM | POA: Diagnosis not present

## 2024-01-21 LAB — POCT URINALYSIS DIPSTICK
Bilirubin, UA: NEGATIVE
Blood, UA: NEGATIVE
Glucose, UA: NEGATIVE
Ketones, UA: NEGATIVE
Nitrite, UA: NEGATIVE
Protein, UA: NEGATIVE
Spec Grav, UA: 1.01 (ref 1.010–1.025)
Urobilinogen, UA: 0.2 U/dL — AB
pH, UA: 5 (ref 5.0–8.0)

## 2024-01-21 NOTE — Addendum Note (Signed)
 Addended by: ELNOR FOY T on: 01/21/2024 12:05 PM   Modules accepted: Orders

## 2024-01-22 LAB — URINE CULTURE
MICRO NUMBER:: 17104174
Result:: NO GROWTH
SPECIMEN QUALITY:: ADEQUATE

## 2024-01-26 ENCOUNTER — Ambulatory Visit (INDEPENDENT_AMBULATORY_CARE_PROVIDER_SITE_OTHER): Admitting: Nurse Practitioner

## 2024-01-26 ENCOUNTER — Encounter: Payer: Self-pay | Admitting: Nurse Practitioner

## 2024-01-26 VITALS — BP 124/82 | HR 74 | Temp 97.5°F | Ht 63.5 in | Wt 158.0 lb

## 2024-01-26 DIAGNOSIS — M069 Rheumatoid arthritis, unspecified: Secondary | ICD-10-CM | POA: Insufficient documentation

## 2024-01-26 DIAGNOSIS — I10 Essential (primary) hypertension: Secondary | ICD-10-CM | POA: Diagnosis not present

## 2024-01-26 DIAGNOSIS — E785 Hyperlipidemia, unspecified: Secondary | ICD-10-CM | POA: Diagnosis not present

## 2024-01-26 DIAGNOSIS — F329 Major depressive disorder, single episode, unspecified: Secondary | ICD-10-CM | POA: Insufficient documentation

## 2024-01-26 DIAGNOSIS — I728 Aneurysm of other specified arteries: Secondary | ICD-10-CM | POA: Diagnosis not present

## 2024-01-26 DIAGNOSIS — M150 Primary generalized (osteo)arthritis: Secondary | ICD-10-CM | POA: Diagnosis not present

## 2024-01-26 NOTE — Progress Notes (Signed)
 Careteam: Patient Care Team: Caro Harlene POUR, NP as PCP - General (Geriatric Medicine) Anner Alm ORN, MD as PCP - Cardiology (Cardiology) Anner Alm ORN, MD as Consulting Physician (Cardiology) Melodi Lerner, MD as Consulting Physician (Orthopedic Surgery)  PLACE OF SERVICE:  [facility]  Advanced Directive information    No Known Allergies  Chief Complaint  Patient presents with   Medical Management of Chronic Issues    6 month routine visit. Patient has not received TD shot yet.  Patients only concern was regarding her UTI from last week.     HPI:  Discussed the use of AI scribe software for clinical note transcription with the patient, who gave verbal consent to proceed.  History of Present Illness April Terry is an 83 year old female who presents for a six-month follow-up after knee replacement surgery.  She underwent knee replacement surgery two months ago, which she states went well. She experiences some stiffness but has graduated from physical therapy and is no longer under the surgeon's care. There are no issues with her other knee.  She continues to take irbesartan  and hydrochlorothiazide  daily for blood pressure management, which is well-controlled. She also takes atorvastatin  10 mg daily for cholesterol, which was last checked in March and was well-controlled. Additionally, she takes sertraline  15 mg daily for mood, which she states helps 'keep the edge off'.  She has a history of rheumatoid arthritis, previously affecting only her knee. She has stopped taking sulfasalazine and reports no current pain or inflammation.  Following surgery, she experienced symptoms suggestive of a urinary tract infection due to catheter use, including uncomfortable urination and urgency. These symptoms have been improving with increased water  and cranberry juice intake. A urine culture showed no growth. Blood work was performed after surgery.  She has a significant family  history of aortic aneurysms, prompting a recent CT scan to rule out abdominal aortic aneurysm. 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 She recalls being told there was a mildly enlarged heart with some thickening, and that her cardiologist is monitoring this. She is monitored annually by a cardiologist.  No blood in urine, vaginal bleeding, or significant respiratory symptoms, though she has an occasional cough attributed to seasonal allergies. Her bowel movements are regular.    Review of Systems:  Review of Systems  Constitutional:  Negative for chills, fever and weight loss.  HENT:  Negative for tinnitus.   Respiratory:  Negative for cough, sputum production and shortness of breath.   Cardiovascular:  Negative for chest pain, palpitations and leg swelling.  Gastrointestinal:  Negative for abdominal pain, constipation, diarrhea and heartburn.  Genitourinary:  Negative for dysuria, frequency and urgency.  Musculoskeletal:  Negative for back pain, falls, joint pain and myalgias.  Skin: Negative.   Neurological:  Negative for dizziness and headaches.  Psychiatric/Behavioral:  Negative for depression and memory loss. The patient does not have insomnia.     Past Medical History:  Diagnosis Date   Anemia    Essential hypertension    Fibroids    Per [facility]   H/O polymyalgia rheumatica    Was symptoms have mostly resolved after prolonged treatment with steroids   Hashimoto's thyroiditis    Per [facility]   Hyperlipidemia    Well-controlled on fish oil and low-dose Lipitor.   Lactose intolerance    Per [facility]   Osteopenia of hip, unspecified laterality    Per [facility]   Osteopenia of spine  Per [facility]   Polymyalgia rheumatica    Per [facility]   Pure hypercholesterolemia    Per [facility]   Rheumatoid arthritis involving both hands (HCC)    Tingling of both feet    Per [facility]   Vitamin D   deficiency    Per [facility]   Past Surgical History:  Procedure Laterality Date   ABDOMINAL HYSTERECTOMY  1984   APPENDECTOMY  1950   COLONOSCOPY  2003   Per [facility]   COLONOSCOPY  2013   COLONOSCOPY  2024   Per [facility]   PARTIAL HYSTERECTOMY  1983   Ovaries retained, Per [facility]   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  EF 55-60%.  No regional wall motion normality.  No mitral prolapse noted.   Social History:   reports that she has never smoked. She has never used smokeless tobacco. She reports current alcohol use. She reports that she does not use drugs.  Family History  Problem Relation Age of Onset   Obesity Mother    Heart failure Mother    Stroke Father    Heart disease Sister    Heart failure Sister    Arthritis Brother    Heart disease Brother    Colon cancer Brother        Per [facility]   Obesity Brother    Diabetes Brother        [facility]   Thyroid  cancer Daughter        10-12 years ago as of 2024   Depression Son    Anxiety disorder Son    Alcoholism Son        Recovering   Heart failure Maternal Aunt        Per [facility]   Aortic aneurysm Cousin     Medications: Patient's Medications  New Prescriptions   No medications on file  Previous Medications   ACETAMINOPHEN  (TYLENOL ) 325 MG TABLET    Take 650 mg by mouth every 6 (six) hours as needed for moderate pain (pain score 4-6).   ATORVASTATIN  (LIPITOR) 10 MG TABLET    Take 1 tablet (10 mg total) by mouth daily.   CALCIUM  CARB-CHOLECALCIFEROL 1000-800 MG-UNIT TABS    Take 1 tablet by mouth daily.   COVID-19 MRNA VACCINE, PFIZER, (COMIRNATY ) SYRINGE    Inject 0.5 mLs into the muscle as directed.   IRBESARTAN -HYDROCHLOROTHIAZIDE  (AVALIDE)  150-12.5 MG TABLET    Take 1 tablet by mouth daily.   MULTIPLE VITAMINS-MINERALS (ONE A DAY WOMEN 50 PLUS) TABS    Take by mouth daily.   MULTIVITAMIN-LUTEIN (OCUVITE-LUTEIN) CAPS CAPSULE    Take 1 capsule by mouth daily.   SERTRALINE  (ZOLOFT ) 50 MG TABLET    Take 1 tablet (50 mg total) by mouth daily.  Modified Medications   No medications on file  Discontinued Medications   No medications on file    Physical Exam:  Vitals:   01/26/24 1011  BP: 124/82  Pulse: 74  Temp: (!) 97.5 F (36.4 C)  TempSrc: Temporal  SpO2: 98%  Weight: 158 lb (71.7 kg)  Height: 5' 3.5 (1.613 m)   Body mass index is 27.55 kg/m. Wt Readings from Last 3 Encounters:  01/26/24 158 lb (71.7 kg)  01/20/24 160 lb (72.6 kg)  12/15/23 156  lb (70.8 kg)    Physical Exam Constitutional:      General: She is not in acute distress.    Appearance: She is well-developed. She is not diaphoretic.  HENT:     Head: Normocephalic and atraumatic.     Mouth/Throat:     Pharynx: No oropharyngeal exudate.  Eyes:     Conjunctiva/sclera: Conjunctivae normal.     Pupils: Pupils are equal, round, and reactive to light.  Cardiovascular:     Rate and Rhythm: Normal rate and regular rhythm.     Heart sounds: Normal heart sounds.  Pulmonary:     Effort: Pulmonary effort is normal.     Breath sounds: Normal breath sounds.  Abdominal:     General: Bowel sounds are normal.     Palpations: Abdomen is soft.  Musculoskeletal:     Cervical back: Normal range of motion and neck supple.     Right lower leg: No edema.     Left lower leg: No edema.  Skin:    General: Skin is warm and dry.  Neurological:     Mental Status: She is alert.  Psychiatric:        Mood and Affect: Mood normal.     Labs reviewed: Basic Metabolic Panel: Recent Labs    10/17/23 1112 10/28/23 0339 01/20/24 0943  NA 140 140 140  K 3.8 3.2* 3.9  CL 106 110 103  CO2 26 25 29   GLUCOSE 103* 137* 91  BUN 16 10 17   CREATININE 0.69 0.52 0.76   CALCIUM  9.6 7.4* 9.0   Liver Function Tests: Recent Labs    07/07/23 1059  AST 19  ALT 19  BILITOT 0.7  PROT 6.5   No results for input(s): LIPASE, AMYLASE in the last 8760 hours. No results for input(s): AMMONIA in the last 8760 hours. CBC: Recent Labs    07/07/23 1059 10/17/23 1112 10/28/23 0339 01/20/24 0943  WBC 6.0 6.7 12.4* 6.4  NEUTROABS 3,864  --   --  3,917  HGB 13.5 13.8 10.2* 13.3  HCT 39.8 39.8 30.9* 39.8  MCV 102.8* 101.3* 103.7* 101.0*  PLT 237 219 178 225   Lipid Panel: Recent Labs    07/07/23 1059  CHOL 153  HDL 63  LDLCALC 71  TRIG 107  CHOLHDL 2.4   TSH: No results for input(s): TSH in the last 8760 hours. A1C: No results found for: HGBA1C   Assessment/Plan 1. Primary hypertension (Primary) -Blood pressure well controlled, goal bp <140/90 Continue current medications and dietary modifications follow metabolic panel  2. 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 Hyperlipidemia with target LDL less than 100 -LDL at goal on lipitor 10 mg daily Continue dietary modifications  4. Primary osteoarthritis involving multiple joints -s/p knee replacement doing well post op. Has completed PT and ortho follow up  5. 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  Doreather Hoxworth K. Caro BODILY [facility] [phone]

## 2024-01-28 ENCOUNTER — Other Ambulatory Visit: Payer: Self-pay | Admitting: Medical Genetics

## 2024-03-09 ENCOUNTER — Other Ambulatory Visit

## 2024-03-09 DIAGNOSIS — Z006 Encounter for examination for normal comparison and control in clinical research program: Secondary | ICD-10-CM

## 2024-03-17 LAB — GENECONNECT MOLECULAR SCREEN: Genetic Analysis Overall Interpretation: NEGATIVE

## 2024-07-26 ENCOUNTER — Ambulatory Visit: Payer: Self-pay | Admitting: Nurse Practitioner

## 2024-12-20 ENCOUNTER — Ambulatory Visit: Payer: Self-pay | Admitting: Nurse Practitioner
